# Patient Record
Sex: Male | Born: 1950 | Race: White | Hispanic: No | Marital: Married | Smoking: Never smoker
Health system: Southern US, Community
[De-identification: ages and names within clinical notes are randomized; demographics above are authoritative.]

## PROBLEM LIST (undated history)

## (undated) DIAGNOSIS — T7840XA Allergy, unspecified, initial encounter: Secondary | ICD-10-CM

## (undated) DIAGNOSIS — Z9889 Other specified postprocedural states: Secondary | ICD-10-CM

## (undated) DIAGNOSIS — I10 Essential (primary) hypertension: Secondary | ICD-10-CM

## (undated) DIAGNOSIS — T4145XA Adverse effect of unspecified anesthetic, initial encounter: Secondary | ICD-10-CM

## (undated) DIAGNOSIS — R6889 Other general symptoms and signs: Secondary | ICD-10-CM

## (undated) DIAGNOSIS — F419 Anxiety disorder, unspecified: Secondary | ICD-10-CM

## (undated) DIAGNOSIS — E781 Pure hyperglyceridemia: Secondary | ICD-10-CM

## (undated) DIAGNOSIS — T8859XA Other complications of anesthesia, initial encounter: Secondary | ICD-10-CM

## (undated) DIAGNOSIS — G4730 Sleep apnea, unspecified: Secondary | ICD-10-CM

## (undated) DIAGNOSIS — C61 Malignant neoplasm of prostate: Secondary | ICD-10-CM

## (undated) DIAGNOSIS — F32A Depression, unspecified: Secondary | ICD-10-CM

## (undated) DIAGNOSIS — N200 Calculus of kidney: Secondary | ICD-10-CM

## (undated) DIAGNOSIS — G709 Myoneural disorder, unspecified: Secondary | ICD-10-CM

## (undated) DIAGNOSIS — K635 Polyp of colon: Secondary | ICD-10-CM

## (undated) DIAGNOSIS — F329 Major depressive disorder, single episode, unspecified: Secondary | ICD-10-CM

## (undated) DIAGNOSIS — R112 Nausea with vomiting, unspecified: Secondary | ICD-10-CM

## (undated) DIAGNOSIS — M1990 Unspecified osteoarthritis, unspecified site: Secondary | ICD-10-CM

## (undated) HISTORY — DX: Essential (primary) hypertension: I10

## (undated) HISTORY — PX: KIDNEY STONE SURGERY: SHX686

## (undated) HISTORY — DX: Sleep apnea, unspecified: G47.30

## (undated) HISTORY — PX: TONSILLECTOMY AND ADENOIDECTOMY: SUR1326

## (undated) HISTORY — PX: OTHER SURGICAL HISTORY: SHX169

## (undated) HISTORY — PX: COLONOSCOPY: SHX174

## (undated) HISTORY — DX: Pure hyperglyceridemia: E78.1

## (undated) HISTORY — DX: Depression, unspecified: F32.A

## (undated) HISTORY — DX: Malignant neoplasm of prostate: C61

## (undated) HISTORY — PX: CATARACT EXTRACTION: SUR2

## (undated) HISTORY — PX: KNEE SURGERY: SHX244

## (undated) HISTORY — DX: Polyp of colon: K63.5

## (undated) HISTORY — PX: RETINAL TEAR REPAIR CRYOTHERAPY: SHX5304

## (undated) HISTORY — DX: Myoneural disorder, unspecified: G70.9

## (undated) HISTORY — DX: Calculus of kidney: N20.0

## (undated) HISTORY — DX: Major depressive disorder, single episode, unspecified: F32.9

## (undated) HISTORY — DX: Allergy, unspecified, initial encounter: T78.40XA

## (undated) HISTORY — DX: Unspecified osteoarthritis, unspecified site: M19.90

---

## 1993-07-25 HISTORY — PX: CARDIAC CATHETERIZATION: SHX172

## 2002-01-03 ENCOUNTER — Ambulatory Visit (HOSPITAL_BASED_OUTPATIENT_CLINIC_OR_DEPARTMENT_OTHER): Admission: RE | Admit: 2002-01-03 | Discharge: 2002-01-03 | Payer: Self-pay | Admitting: Otolaryngology

## 2007-01-08 ENCOUNTER — Ambulatory Visit: Payer: Self-pay | Admitting: Gastroenterology

## 2007-01-10 ENCOUNTER — Ambulatory Visit: Payer: Self-pay | Admitting: Gastroenterology

## 2007-01-10 ENCOUNTER — Encounter: Payer: Self-pay | Admitting: Gastroenterology

## 2007-01-10 DIAGNOSIS — K5730 Diverticulosis of large intestine without perforation or abscess without bleeding: Secondary | ICD-10-CM | POA: Insufficient documentation

## 2007-01-10 DIAGNOSIS — D126 Benign neoplasm of colon, unspecified: Secondary | ICD-10-CM | POA: Insufficient documentation

## 2007-07-26 HISTORY — PX: EYE SURGERY: SHX253

## 2007-09-25 ENCOUNTER — Ambulatory Visit (HOSPITAL_COMMUNITY): Admission: RE | Admit: 2007-09-25 | Discharge: 2007-09-26 | Payer: Self-pay | Admitting: Obstetrics and Gynecology

## 2008-07-15 ENCOUNTER — Telehealth: Payer: Self-pay | Admitting: Gastroenterology

## 2008-07-16 ENCOUNTER — Ambulatory Visit: Payer: Self-pay | Admitting: Gastroenterology

## 2008-07-16 DIAGNOSIS — K5900 Constipation, unspecified: Secondary | ICD-10-CM | POA: Insufficient documentation

## 2008-07-16 DIAGNOSIS — K625 Hemorrhage of anus and rectum: Secondary | ICD-10-CM | POA: Insufficient documentation

## 2009-01-30 ENCOUNTER — Encounter: Admission: RE | Admit: 2009-01-30 | Discharge: 2009-01-30 | Payer: Self-pay | Admitting: Emergency Medicine

## 2010-12-07 NOTE — Op Note (Signed)
NAMETIERRE, NETTO NO.:  [id number]   MEDICAL RECORD NO.:  [id number]          PATIENT TYPE:  OIB   LOCATION:  2550                         FACILITY:  [facility]   PHYSICIAN:  Beulah Gandy. Ashley Royalty, M.D. 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   PROCEDURE:  1. Scleral buckle right eye.  2. Retinal photocoagulation right eye.   SURGEON:  Beulah Gandy. Ashley Royalty, M.D.   ASSISTANT:  Rosalie Doctor, MA   ANESTHESIA:  General.   DETAILS:  Usual prep and drape, 360-degree limbal peritomy.  Isolation  of four rectus muscles on 2-0 silk.  Scleral dissection from 2 o'clock  to 8 o'clock to admit a #279 intrascleral implant, diathermy  placed in  the bed.  Two sutures per quadrant for a total of five sutures were  placed in the scleral flaps.  A 279 implant was fashioned to fit against  the globe between 2 and 8 o'clock.  A 240 band was placed around the  globe with a joint at  2 o'clock with a 270 sleeve.  The belt loops at  10 o'clock and 11 o'clock, perforation site chosen at 2 o'clock in the  anterior aspect of the bed.  A moderate amount of yellow subretinal  fluid came forth, then some clear fluid came forth.  The scleral sutures  were drawn securely, knotted, and the free ends removed.  Indirect  ophthalmoscopy showed the retina to be lying nicely in place on the  scleral buckle.  The indirect ophthalmoscope laser was moved into place,  686 burns were placed around the  retinal periphery with a power of 400  milliwatts, 1000 microns each and 0.1 seconds each.  The buckle was  adjusted and trimmed.  The band was adjusted and trimmed.  Paracentesis  x1 obtained a closing tension of 15 with a Barraquer tonometer. The  conjunctiva was reposited with 7-0 chromic suture Polymyxin and  gentamicin were irrigated in the  tenon space.  Decadron 10 mg was  injected into the lower subconjunctival space.  Atropine solution was  applied.  Marcaine was injected around the globe for postop pain.  TobraDex ophthalmic ointment, a patch and shield were placed.  The  patient was awakened, and taken to recovery in satisfactory condition.   COMPLICATIONS:  None.   DURATION:  1 hour and 45 minutes.      Beulah Gandy. Ashley Royalty, M.D.  Electronically Signed    JDM/MEDQ  D:  09/25/2007  T:  09/25/2007  Job:  [id number]

## 2011-04-18 LAB — CBC
Hemoglobin: 15.3
MCHC: 35
RBC: 4.8
WBC: 7.5

## 2011-04-18 LAB — BASIC METABOLIC PANEL
CO2: 26
Calcium: 9
Creatinine, Ser: 1.1
GFR calc Af Amer: >60
GFR calc non Af Amer: >60
Sodium: 134 — ABNORMAL LOW

## 2011-11-02 ENCOUNTER — Encounter: Payer: Self-pay | Admitting: Gastroenterology

## 2011-12-21 ENCOUNTER — Ambulatory Visit: Payer: BC Managed Care – PPO

## 2011-12-21 ENCOUNTER — Ambulatory Visit (INDEPENDENT_AMBULATORY_CARE_PROVIDER_SITE_OTHER): Payer: BC Managed Care – PPO | Admitting: Emergency Medicine

## 2011-12-21 VITALS — BP 118/78 | HR 61 | Temp 97.9°F | Resp 18 | Ht 67.0 in | Wt 210.0 lb

## 2011-12-21 DIAGNOSIS — R5383 Other fatigue: Secondary | ICD-10-CM

## 2011-12-21 DIAGNOSIS — E781 Pure hyperglyceridemia: Secondary | ICD-10-CM

## 2011-12-21 DIAGNOSIS — R5381 Other malaise: Secondary | ICD-10-CM

## 2011-12-21 DIAGNOSIS — Z0000 Encounter for general adult medical examination without abnormal findings: Secondary | ICD-10-CM

## 2011-12-21 LAB — POCT CBC
Hemoglobin: 16.7 g/dL (ref 14.1–18.1)
Lymph, poc: 2.6 (ref 0.6–3.4)
MCH, POC: 32.4 pg — AB (ref 27–31.2)
MCHC: 35.2 g/dL (ref 31.8–35.4)
MPV: 8.6 fL (ref 0–99.8)
POC Granulocyte: 5.7 (ref 2–6.9)
POC LYMPH PERCENT: 28.5 %L (ref 10–50)
POC MID %: 8.1 %M (ref 0–12)
RDW, POC: 12.8 %
WBC: 9 10*3/uL (ref 4.6–10.2)

## 2011-12-21 LAB — POCT URINALYSIS DIPSTICK
Bilirubin, UA: NEGATIVE
Ketones, UA: NEGATIVE
Nitrite, UA: NEGATIVE
Protein, UA: NEGATIVE
pH, UA: 5

## 2011-12-21 LAB — POCT UA - MICROSCOPIC ONLY
Casts, Ur, LPF, POC: NEGATIVE
Crystals, Ur, HPF, POC: NEGATIVE
Yeast, UA: NEGATIVE

## 2011-12-21 LAB — COMPREHENSIVE METABOLIC PANEL
ALT: 30 U/L (ref 0–53)
AST: 34 U/L (ref 0–37)
Alkaline Phosphatase: 75 U/L (ref 39–117)
BUN: 17 mg/dL (ref 6–23)
Creat: 1.09 mg/dL (ref 0.50–1.35)
Total Bilirubin: 0.5 mg/dL (ref 0.3–1.2)

## 2011-12-21 LAB — LIPID PANEL
HDL: 26 mg/dL — ABNORMAL LOW (ref >39)
Total CHOL/HDL Ratio: 9.1 Ratio
Triglycerides: 1765 mg/dL — ABNORMAL HIGH (ref <150)

## 2011-12-21 MED ORDER — LISINOPRIL 20 MG PO TABS: 20.0000 mg | ORAL_TABLET | Freq: Every day | ORAL | Status: DC

## 2011-12-21 MED ORDER — NIACIN ER (ANTIHYPERLIPIDEMIC) 500 MG PO TBCR: 500.0000 mg | EXTENDED_RELEASE_TABLET | Freq: Every day | ORAL | Status: DC

## 2011-12-21 MED ORDER — MELOXICAM 15 MG PO TABS: 15.0000 mg | ORAL_TABLET | Freq: Every day | ORAL | Status: DC

## 2011-12-21 MED ORDER — VITAMIN D (ERGOCALCIFEROL) 1.25 MG (50000 UNIT) PO CAPS: 50000.0000 [IU] | ORAL_CAPSULE | ORAL | Status: DC

## 2011-12-21 MED ORDER — VENLAFAXINE HCL 75 MG PO TABS: 75.0000 mg | ORAL_TABLET | Freq: Two times a day (BID) | ORAL | Status: DC

## 2011-12-21 MED ORDER — OMEGA-3 FATTY ACIDS 1000 MG PO CAPS: 2.0000 g | ORAL_CAPSULE | Freq: Every day | ORAL | Status: DC

## 2011-12-21 MED ORDER — AMLODIPINE BESYLATE 10 MG PO TABS: 10.0000 mg | ORAL_TABLET | Freq: Every day | ORAL | Status: DC

## 2011-12-21 NOTE — Progress Notes (Signed)
  Subjective:    Patient ID: [name], male    DOB: 09/01/1950, 61 y.o.   MRN: [id number]  HPI    Review of Systems     Objective:   Physical Exam        Assessment & Plan:

## 2011-12-21 NOTE — Progress Notes (Signed)
  Subjective:    Patient ID: [name], male    DOB: [birth year], 61 y.o.   MRN: [id number]  HPI    Review of Systems     Objective:   Physical Exam  [facility] reading (PRIMARY) by  Dr. Cleta Alberts NAD       Assessment & Plan:

## 2011-12-21 NOTE — Progress Notes (Signed)
Subjective:    Patient ID: [name], male    DOB: Apr 26, 1951, 61 y.o.   MRN: [id number]  HPI patient here for his annual physical exam. He's had no recent major issues. 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    Review of Systems  Constitutional: Positive for appetite change and fatigue.  HENT: Positive for neck stiffness.   Eyes: Positive for visual disturbance.  Genitourinary: Positive for frequency and difficulty urinating.  Musculoskeletal: Positive for back pain.       Objective:   Physical Exam  Constitutional: He is oriented to person, place, and time. He appears well-developed and well-nourished.  HENT:  Head: Normocephalic.  Eyes: Pupils are equal, round, and reactive to light.  Neck: No thyromegaly present.  Cardiovascular: Normal rate, regular rhythm and normal heart sounds.  Exam reveals no friction rub.   No murmur heard. Pulmonary/Chest: Effort normal and breath sounds normal. No respiratory distress. He has no wheezes. He has no rales. He exhibits no tenderness.  Abdominal: Soft. Bowel sounds are normal. There is no tenderness. There is no rebound.  Genitourinary:       Examination of prostate reveals the left lobe to be slightly more prominent than the right.  Musculoskeletal:       There is tenderness over the right proximal humerus. There is some pain with full abduction. There is no true rotator cuff weakness .  Neurological: He is alert and oriented to person, place, and time. He has normal reflexes. No cranial nerve deficit. Coordination normal.  Skin: Skin is warm and dry.  Psychiatric: He has a normal mood and affect. His behavior is normal. Judgment and thought content normal.         Results for orders placed in visit on 12/21/11  POCT CBC      Component Value Range   WBC 9.0  4.6 - 10.2 (K/uL)   Lymph, poc 2.6  0.6 - 3.4    POC LYMPH PERCENT 28.5  10 - 50 (%L)   MID  (cbc) 0.7  0 - 0.9    POC MID % 8.1  0 - 12 (%M)   POC Granulocyte 5.7  2 - 6.9    Granulocyte percent 63.4  37 - 80 (%G)   RBC 5.15  4.69 - 6.13 (M/uL)   Hemoglobin 16.7  14.1 - 18.1 (g/dL)   HCT, POC 40.9  81.1 - 53.7 (%)   MCV 92.2  80 - 97 (fL)   MCH, POC 32.4 (*) 27 - 31.2 (pg)   MCHC 35.2  31.8 - 35.4 (g/dL)   RDW, POC 91.4     Platelet Count, POC 271  142 - 424 (K/uL)   MPV 8.6  0 - 99.8 (fL)  POCT URINALYSIS DIPSTICK      Component Value Range   Color, UA yellow     Clarity, UA clear     Glucose, UA neg     Bilirubin, UA neg     Ketones, UA neg     Spec Grav, UA >=1.030     Blood, UA trace-lysed     pH, UA 5.0     Protein, UA neg     Urobilinogen, UA 0.2     Nitrite, UA neg     Leukocytes, UA Negative    POCT UA - MICROSCOPIC ONLY      Component Value Range   WBC, Ur, HPF, POC 0-1  RBC, urine, microscopic 0-1     Bacteria, U Microscopic trace     Mucus, UA trace     Epithelial cells, urine per micros 0-1     Crystals, Ur, HPF, POC neg     Casts, Ur, LPF, POC neg     Yeast, UA neg    IFOBT (OCCULT BLOOD)      Component Value Range   IFOBT Positive      Assessment & Plan:         Patient is overall doing well. All meds are refilled. He did have a heme positive stool and referral has been made to GI for this. His meds were refilled and labs done.

## 2012-01-18 ENCOUNTER — Ambulatory Visit: Payer: Self-pay | Admitting: Gastroenterology

## 2012-01-24 ENCOUNTER — Encounter: Payer: Self-pay | Admitting: Gastroenterology

## 2012-01-24 ENCOUNTER — Ambulatory Visit (INDEPENDENT_AMBULATORY_CARE_PROVIDER_SITE_OTHER): Payer: Self-pay | Admitting: Gastroenterology

## 2012-01-24 VITALS — BP 110/80 | HR 64 | Ht 69.0 in | Wt 201.6 lb

## 2012-01-24 DIAGNOSIS — R195 Other fecal abnormalities: Secondary | ICD-10-CM

## 2012-01-24 DIAGNOSIS — Z86010 Personal history of colonic polyps: Secondary | ICD-10-CM

## 2012-01-24 MED ORDER — MOVIPREP 100 G PO SOLR: 1.0000 | ORAL | Status: DC

## 2012-01-24 NOTE — Progress Notes (Signed)
HPI: This is a   very pleasant 61 year old man whom I last saw about 5 years ago for a routine colonoscopy.  Colonoscopy 12/2006 for routine screening, left sided tics and small adenoma removed. Recommended to have recall colonoscopy at 5 year interval.  Reminder letter sent 2 months ago.  Went to pcp, heme tested and was positive for blood.    CBC and complete metabolic profile may 2013 were both normal.  He was found to have extremely high triglyceride level. Thyroid testing was normal.  He sees minor rectal bleeding with hemorrhoid every 1-2 months.   No significant constipation or diarrhea.  Weight overall stable.  He lives here but he works in [place], with the [employer], football team     Review of systems: Pertinent positive and negative review of systems were noted in the above HPI section. Complete review of systems was performed and was otherwise normal.    Past Medical History  Diagnosis Date  . Pure hyperglyceridemia   . Benign neoplasm of colon   . Colon polyps   . Sleep apnea   . Depression   . Hypertension   . Kidney stones     Past Surgical History  Procedure Date  . Retinal tear repair cryotherapy   . Knee surgery   . Kidney stone surgery   . Tonsillectomy and adenoidectomy     Current Outpatient Prescriptions  Medication Sig Dispense Refill  . amLODipine (NORVASC) 10 MG tablet Take 1 tablet (10 mg total) by mouth daily.  30 tablet  11  . Cyanocobalamin (VITAMIN B 12) 100 MCG LOZG Take by mouth.      . fish oil-omega-3 fatty acids 1000 MG capsule Take 2 capsules (2 g total) by mouth daily.  30 capsule  11  . lisinopril (PRINIVIL,ZESTRIL) 20 MG tablet Take 1 tablet (20 mg total) by mouth daily.  30 tablet  11  . meloxicam (MOBIC) 15 MG tablet Take 1 tablet (15 mg total) by mouth daily.  30 tablet  11  . niacin (NIASPAN) 500 MG CR tablet Take 1 tablet (500 mg total) by mouth at bedtime.  30 tablet  11  . venlafaxine  (EFFEXOR) 75 MG tablet Take 1 tablet (75 mg total) by mouth 2 (two) times daily.  30 tablet  11  . Vitamin D, Ergocalciferol, (DRISDOL) 50000 UNITS CAPS Take 1 capsule (50,000 Units total) by mouth every 7 (seven) days.  30 capsule  1    Allergies as of 01/24/2012  . (No Known Allergies)    Family History  Problem Relation Age of Onset  . Clotting disorder Mother   . Colon polyps Father   . Diabetes Father     History   Social History  . Marital Status: Married    Spouse Name: N/A    Number of Children: 3  . Years of Education: N/A   Occupational History  . Football coach    Social History Main Topics  . Smoking status: Never Smoker   . Smokeless tobacco: Never Used  . Alcohol Use: Yes     social  . Drug Use: No  . Sexually Active: Not on file   Other Topics Concern  . Not on file   Social History Narrative  . No narrative on file       Physical Exam: BP 110/80  Pulse 64  Ht 5\' 9"  (1.753 m)  Wt 201 lb 9.6 oz (91.445 kg)  BMI 29.77 kg/m2 Constitutional: generally  well-appearing Psychiatric: alert and oriented x3 Eyes: extraocular movements intact Mouth: oral pharynx moist, no lesions Neck: supple no lymphadenopathy Cardiovascular: heart regular rate and rhythm Lungs: clear to auscultation bilaterally Abdomen: soft, nontender, nondistended, no obvious ascites, no peritoneal signs, normal bowel sounds Extremities: no lower extremity edema bilaterally Skin: no lesions on visible extremities    Assessment and plan: 61 y.o. male with  personal history of adenomatous colon polyps, Hemoccult-positive stool  He is due for five-year recall colonoscopy around now and we will set that up for him.

## 2012-01-24 NOTE — Patient Instructions (Addendum)
You will be set up for a colonoscopy for hemocult positive stool, personal history of adenomatous polyps.

## 2012-01-25 ENCOUNTER — Other Ambulatory Visit: Payer: Self-pay | Admitting: Dermatology

## 2012-01-25 ENCOUNTER — Encounter: Payer: Self-pay | Admitting: *Deleted

## 2012-01-25 NOTE — Telephone Encounter (Signed)
error 

## 2012-01-30 ENCOUNTER — Ambulatory Visit (AMBULATORY_SURGERY_CENTER): Payer: BC Managed Care – PPO | Admitting: Gastroenterology

## 2012-01-30 ENCOUNTER — Encounter: Payer: Self-pay | Admitting: Gastroenterology

## 2012-01-30 VITALS — BP 116/73 | HR 74 | Temp 97.8°F | Resp 18 | Ht 69.0 in | Wt 201.6 lb

## 2012-01-30 DIAGNOSIS — K644 Residual hemorrhoidal skin tags: Secondary | ICD-10-CM

## 2012-01-30 DIAGNOSIS — Z86010 Personal history of colonic polyps: Secondary | ICD-10-CM

## 2012-01-30 DIAGNOSIS — K5730 Diverticulosis of large intestine without perforation or abscess without bleeding: Secondary | ICD-10-CM

## 2012-01-30 DIAGNOSIS — R195 Other fecal abnormalities: Secondary | ICD-10-CM

## 2012-01-30 MED ORDER — SODIUM CHLORIDE 0.9 % IV SOLN: 500.0000 mL | INTRAVENOUS | Status: DC

## 2012-01-30 NOTE — Op Note (Signed)
[facility] [address] [place], [place]  [address]  COLONOSCOPY PROCEDURE REPORT  PATIENT:  [name], [name]  MR#:  [id number] BIRTHDATE:  1950/12/23, 60 yrs. old  GENDER:  male ENDOSCOPIST:  Rachael Fee, MD PROCEDURE DATE:  01/30/2012 PROCEDURE:  Colonoscopy 01027 ASA CLASS:  Class II INDICATIONS:  2008 colonoscopy with TA; also recent heme + stool by PCP MEDICATIONS:   Fentanyl 75 mcg IV, These medications were titrated to patient response per physician's verbal order, Versed 7 mg IV  DESCRIPTION OF PROCEDURE:   After the risks benefits and alternatives of the procedure were thoroughly explained, informed consent was obtained.  Digital rectal exam was performed and revealed no rectal masses.   The LB [id number] endoscope was introduced through the anus and advanced to the cecum, which was identified by both the appendix and ileocecal valve, without limitations.  The quality of the prep was good..  The instrument was then slowly withdrawn as the colon was fully examined. <<PROCEDUREIMAGES>> FINDINGS:  Mild diverticulosis was found in the sigmoid to descending colon segments (see image1).  Internal and external Hemorrhoids were found.  This was otherwise a normal examination of the colon (see image2, image3, and image5).   Retroflexed views in the rectum revealed no abnormalities. COMPLICATIONS:  None  ENDOSCOPIC IMPRESSION: 1) Mild diverticulosis in the sigmoid to descending colon segments 2) Internal and external hemorrhoids 3) Otherwise normal examination; no polyps or cancers  RECOMMENDATIONS: 1) Given your personal history of adenomatous (pre-cancerous) polyps, you will need a repeat colonoscopy in 5 years.  REPEAT EXAM:  5 years  ______________________________ Rachael Fee, MD  n. eSIGNED:   Rachael Fee at 01/30/2012 02:48 PM  Harolyn Rutherford, [phone]

## 2012-01-30 NOTE — Progress Notes (Signed)
Patient did not experience any of the following events: a burn prior to discharge; a fall within the facility; wrong site/side/patient/procedure/implant event; or a hospital transfer or hospital admission upon discharge from the facility. (G8907) Patient did not have preoperative order for IV antibiotic SSI prophylaxis. (G8918)  

## 2012-01-30 NOTE — Patient Instructions (Signed)
Diverticulosis and hemorrhoids seen today. See handouts. Repeat colonoscopy in 5 years. Thank you!!   YOU HAD AN ENDOSCOPIC PROCEDURE TODAY AT THE [facility]: Refer to the procedure report that was given to you for any specific questions about what was found during the examination.  If the procedure report does not answer your questions, please call your gastroenterologist to clarify.  If you requested that your care partner not be given the details of your procedure findings, then the procedure report has been included in a sealed envelope for you to review at your convenience later.  YOU SHOULD EXPECT: Some feelings of bloating in the abdomen. Passage of more gas than usual.  Walking can help get rid of the air that was put into your GI tract during the procedure and reduce the bloating. If you had a lower endoscopy (such as a colonoscopy or flexible sigmoidoscopy) you may notice spotting of blood in your stool or on the toilet paper. If you underwent a bowel prep for your procedure, then you may not have a normal bowel movement for a few days.  DIET: Your first meal following the procedure should be a light meal and then it is ok to progress to your normal diet.  A half-sandwich or bowl of soup is an example of a good first meal.  Heavy or fried foods are harder to digest and may make you feel nauseous or bloated.  Likewise meals heavy in dairy and vegetables can cause extra gas to form and this can also increase the bloating.  Drink plenty of fluids but you should avoid alcoholic beverages for 24 hours.  ACTIVITY: Your care partner should take you home directly after the procedure.  You should plan to take it easy, moving slowly for the rest of the day.  You can resume normal activity the day after the procedure however you should NOT DRIVE or use heavy machinery for 24 hours (because of the sedation medicines used during the test).    SYMPTOMS TO REPORT IMMEDIATELY: A  gastroenterologist can be reached at any hour.  During normal business hours, 8:30 AM to 5:00 PM Monday through Friday, call [phone].  After hours and on weekends, please call the GI answering service at [phone] who will take a message and have the physician on call contact you.   Following lower endoscopy (colonoscopy or flexible sigmoidoscopy):  Excessive amounts of blood in the stool  Significant tenderness or worsening of abdominal pains  Swelling of the abdomen that is new, acute  Fever of 100F or higher  FOLLOW UP: If any biopsies were taken you will be contacted by phone or by letter within the next 1-3 weeks.  Call your gastroenterologist if you have not heard about the biopsies in 3 weeks.  Our staff will call the home number listed on your records the next business day following your procedure to check on you and address any questions or concerns that you may have at that time regarding the information given to you following your procedure. This is a courtesy call and so if there is no answer at the home number and we have not heard from you through the emergency physician on call, we will assume that you have returned to your regular daily activities without incident.  SIGNATURES/CONFIDENTIALITY: You and/or your care partner have signed paperwork which will be entered into your electronic medical record.  These signatures attest to the fact that that the information above on your After  Visit Summary has been reviewed and is understood.  Full responsibility of the confidentiality of this discharge information lies with you and/or your care-partner.

## 2012-01-31 ENCOUNTER — Telehealth: Payer: Self-pay

## 2012-01-31 NOTE — Telephone Encounter (Signed)
Left message on answering machine. 

## 2012-02-17 ENCOUNTER — Other Ambulatory Visit: Payer: Self-pay | Admitting: Emergency Medicine

## 2012-02-22 ENCOUNTER — Telehealth: Payer: Self-pay

## 2012-02-22 NOTE — Telephone Encounter (Signed)
He is to take his lisinopril twice a day. He is also to take his Norvasc. Okay to call in lisinopril 20 twice a day and Norvasc 10 one a day

## 2012-02-22 NOTE — Telephone Encounter (Signed)
Lisinopril 20mg  was twice daily, but now the rx is written for once daily, also he is taking Norvasc, wants to know if this is an intentional change or was it in error? Dr Cleta Alberts gave him this at his physical, also is asking if he can cut back to one BP agent or if he should continue both. Please advise

## 2012-02-22 NOTE — Telephone Encounter (Signed)
He is supposed to be on lisinopril 20 twice a day and Norvasc 10 once a day if these are the medications he was on before he is to stay on the same medications he was on when he came for his physical .

## 2012-02-22 NOTE — Telephone Encounter (Signed)
TERESA WOULD LIKE TO SPEAK WITH SOMEONE ABOUT HER HUSBAND'S LISINOPRIL. THERE IS A MIX UP AS TO HOW HE IS TO TAKE HIS MEDS PLEASE CALL [phone]

## 2012-02-23 MED ORDER — LISINOPRIL 20 MG PO TABS: 20.0000 mg | ORAL_TABLET | Freq: Two times a day (BID) | ORAL | Status: DC

## 2012-02-23 NOTE — Telephone Encounter (Signed)
Spoke w/wife and verified that pt had been taking lisinopril BID, not QD, and was also taking the amlodipine. Wife wondered if pt needed to be on both BP meds and stated he had been placed on the 2nd one last Dec. Advised her that Dr Ellis Parents intention was to leave pt on same BP meds/doses as he had been on bc he felt like his BP was well controlled on these. Suggested that pt could keep a diary of his BPs readings for a few weeks prior to next OV so that he could D/W Dr Cleta Alberts about whether he could try cutting back down to just one. Wife agreed. Sending in corrected Rx to pharmacy.

## 2012-03-09 ENCOUNTER — Telehealth: Payer: Self-pay

## 2012-03-09 MED ORDER — VENLAFAXINE HCL 75 MG PO TABS: 75.0000 mg | ORAL_TABLET | Freq: Two times a day (BID) | ORAL | Status: DC

## 2012-03-09 NOTE — Telephone Encounter (Signed)
Effexor 75 mg pharmacy is asking if we can change the quantity to #60 he is taking bid. Chart indicates one year supply given, have corrected our error and sent to pharmacy

## 2012-03-09 NOTE — Telephone Encounter (Signed)
[facility]acy [facility] in [place] in is need auth to increase pt qaunity of medication from 30 to 60 please contact Lisa at [facility] [phone]

## 2012-12-31 ENCOUNTER — Other Ambulatory Visit: Payer: Self-pay | Admitting: Emergency Medicine

## 2013-01-19 ENCOUNTER — Ambulatory Visit (INDEPENDENT_AMBULATORY_CARE_PROVIDER_SITE_OTHER): Payer: BC Managed Care – PPO | Admitting: Emergency Medicine

## 2013-01-19 VITALS — BP 108/70 | HR 60 | Temp 98.3°F | Resp 12 | Ht 66.75 in | Wt 202.0 lb

## 2013-01-19 DIAGNOSIS — M129 Arthropathy, unspecified: Secondary | ICD-10-CM

## 2013-01-19 DIAGNOSIS — Z23 Encounter for immunization: Secondary | ICD-10-CM

## 2013-01-19 DIAGNOSIS — E559 Vitamin D deficiency, unspecified: Secondary | ICD-10-CM

## 2013-01-19 DIAGNOSIS — F32A Depression, unspecified: Secondary | ICD-10-CM

## 2013-01-19 DIAGNOSIS — M1990 Unspecified osteoarthritis, unspecified site: Secondary | ICD-10-CM

## 2013-01-19 DIAGNOSIS — E781 Pure hyperglyceridemia: Secondary | ICD-10-CM

## 2013-01-19 DIAGNOSIS — F329 Major depressive disorder, single episode, unspecified: Secondary | ICD-10-CM

## 2013-01-19 DIAGNOSIS — I10 Essential (primary) hypertension: Secondary | ICD-10-CM

## 2013-01-19 DIAGNOSIS — Z0000 Encounter for general adult medical examination without abnormal findings: Secondary | ICD-10-CM

## 2013-01-19 LAB — LIPID PANEL
LDL Cholesterol: 70 mg/dL (ref 0–99)
Triglycerides: 316 mg/dL — ABNORMAL HIGH (ref <150)
VLDL: 63 mg/dL — ABNORMAL HIGH (ref 0–40)

## 2013-01-19 LAB — COMPREHENSIVE METABOLIC PANEL
ALT: 19 U/L (ref 0–53)
AST: 25 U/L (ref 0–37)
Albumin: 4.3 g/dL (ref 3.5–5.2)
Calcium: 9.4 mg/dL (ref 8.4–10.5)
Chloride: 106 mEq/L (ref 96–112)
Creat: 1.29 mg/dL (ref 0.50–1.35)
Potassium: 4.8 mEq/L (ref 3.5–5.3)
Sodium: 139 mEq/L (ref 135–145)
Total Protein: 6.5 g/dL (ref 6.0–8.3)

## 2013-01-19 LAB — POCT CBC
Hemoglobin: 15 g/dL (ref 14.1–18.1)
Lymph, poc: 1.8 (ref 0.6–3.4)
MCH, POC: 32.2 pg — AB (ref 27–31.2)
MCHC: 32.5 g/dL (ref 31.8–35.4)
MID (cbc): 0.5 (ref 0–0.9)
MPV: 8.5 fL (ref 0–99.8)
POC MID %: 8 %M (ref 0–12)
WBC: 6.1 10*3/uL (ref 4.6–10.2)

## 2013-01-19 LAB — POCT URINALYSIS DIPSTICK
Bilirubin, UA: NEGATIVE
Blood, UA: NEGATIVE
Glucose, UA: NEGATIVE
Nitrite, UA: NEGATIVE
Spec Grav, UA: 1.025

## 2013-01-19 LAB — PSA: PSA: 4.58 ng/mL — ABNORMAL HIGH (ref <=4.00)

## 2013-01-19 LAB — POCT UA - MICROSCOPIC ONLY: Crystals, Ur, HPF, POC: NEGATIVE

## 2013-01-19 MED ORDER — VITAMIN D (ERGOCALCIFEROL) 1.25 MG (50000 UNIT) PO CAPS: 50000.0000 [IU] | ORAL_CAPSULE | ORAL | Status: DC

## 2013-01-19 MED ORDER — VENLAFAXINE HCL 75 MG PO TABS: 75.0000 mg | ORAL_TABLET | Freq: Two times a day (BID) | ORAL | Status: DC

## 2013-01-19 MED ORDER — MELOXICAM 15 MG PO TABS: 15.0000 mg | ORAL_TABLET | Freq: Every day | ORAL | Status: DC

## 2013-01-19 MED ORDER — AMLODIPINE BESYLATE 10 MG PO TABS: 10.0000 mg | ORAL_TABLET | Freq: Every day | ORAL | Status: DC

## 2013-01-19 MED ORDER — NIACIN ER (ANTIHYPERLIPIDEMIC) 500 MG PO TBCR: 500.0000 mg | EXTENDED_RELEASE_TABLET | Freq: Every day | ORAL | Status: DC

## 2013-01-19 MED ORDER — LISINOPRIL 20 MG PO TABS: 20.0000 mg | ORAL_TABLET | Freq: Two times a day (BID) | ORAL | Status: DC

## 2013-01-19 NOTE — Progress Notes (Signed)
@[email or web address] @  Patient ID: [name] MRN: [id number], DOB: 04-12-1951 62 y.o. Date of Encounter: 01/19/2013, 9:24 AM  Primary Physician: Lucilla Edin, MD  Chief Complaint: Physical (CPE)  HPI: 62 y.o. y/o male with history noted below here for CPE.  Doing well. No issues/complaints.  Review of Systems:  Consitutional: No fever, chills, fatigue, night sweats, lymphadenopathy, or weight changes. He has a history of depression and is currently on Effexor Eyes: No visual changes, eye redness, or discharge. ENT/Mouth: Ears: No otalgia, tinnitus, hearing loss, discharge. Nose: No congestion, rhinorrhea, sinus pain, or epistaxis. Throat: No sore throat, post nasal drip, or teeth pain. Cardiovascular: No CP, palpitations, diaphoresis, DOE, edema, orthopnea, PND. Respiratory: No cough, hemoptysis, SOB, or wheezing. Gastrointestinal: No anorexia, dysphagia, reflux, pain, nausea, vomiting, hematemesis, diarrhea, constipation, he intermittently had some bright red blood on his toilet paper  or melena. Genitourinary: No dysuria, frequency, urgency, hematuria, incontinence, nocturia, decreased urinary stream, discharge, impotence, or testicular pain/masses. Musculoskeletal: No decreased ROM, myalgias, stiffness, joint swelling, or weakness. Skin: No rash, erythema, lesion changes, pain, warmth, jaundice, or pruritis. Neurological: No headache, dizziness, syncope, seizures, tremors, memory loss, coordination problems, or paresthesias. Psychological: No anxiety, depression, hallucinations, SI/HI. Endocrine: No fatigue, polydipsia, polyphagia, polyuria, or known diabetes. All other systems were reviewed and are otherwise negative.  Past Medical History  Diagnosis Date  . Pure hyperglyceridemia   . Colon polyps   . Sleep apnea     wears CPAP  . Hypertension   . Kidney stones   . Allergy   . Arthritis   . Cataract   . Depression     no per pt  . Neuromuscular disorder     pinched nerve  right arm     Past Surgical History  Procedure Laterality Date  . Retinal tear repair cryotherapy      right  . Knee surgery      both knees done  . Kidney stone surgery    . Tonsillectomy and adenoidectomy    . Cataract extraction      left  . Colonoscopy    . Eye surgery      Home Meds:  Prior to Admission medications   Medication Sig Start Date End Date Taking? Authorizing Provider  amLODipine (NORVASC) 10 MG tablet Take 1 tablet (10 mg total) by mouth daily. PATIENT NEEDS OFFICE VISIT FOR ADDITIONAL REFILLS 12/31/12  Yes Collene Gobble, MD  Cyanocobalamin (VITAMIN B 12) 100 MCG LOZG Take by mouth.   Yes Historical Provider, MD  fish oil-omega-3 fatty acids 1000 MG capsule Take 2 capsules (2 g total) by mouth daily. 12/21/11  Yes Collene Gobble, MD  lisinopril (PRINIVIL,ZESTRIL) 20 MG tablet Take 1 tablet (20 mg total) by mouth 2 (two) times daily. 02/23/12  Yes Collene Gobble, MD  meloxicam (MOBIC) 15 MG tablet Take 1 tablet (15 mg total) by mouth daily. PATIENT NEEDS OFFICE VISIT FOR ADDITIONAL REFILLS 12/31/12  Yes Collene Gobble, MD  niacin (NIASPAN) 500 MG CR tablet Take 1 tablet (500 mg total) by mouth at bedtime. 12/21/11  Yes Collene Gobble, MD  venlafaxine (EFFEXOR) 75 MG tablet Take 1 tablet (75 mg total) by mouth 2 (two) times daily. 03/09/12  Yes Collene Gobble, MD  Vitamin D, Ergocalciferol, (DRISDOL) 50000 UNITS CAPS Take 1 capsule (50,000 Units total) by mouth every 7 (seven) days. 12/21/11  Yes Collene Gobble, MD    Allergies: No Known Allergies  History   Social History  .  Marital Status: Married    Spouse Name: N/A    Number of Children: 3  . Years of Education: N/A   Occupational History  . Football coach    Social History Main Topics  . Smoking status: Never Smoker   . Smokeless tobacco: Never Used  . Alcohol Use: Yes     Comment: social  . Drug Use: No  . Sexually Active: Not on file   Other Topics Concern  . Not on file   Social History Narrative  . No  narrative on file    Family History  Problem Relation Age of Onset  . Clotting disorder Mother   . Colon polyps Father   . Diabetes Father   . Colon cancer Neg Hx   . Esophageal cancer Neg Hx   . Stomach cancer Neg Hx   . Rectal cancer Neg Hx     Physical Exam:  Blood pressure 108/70, pulse 60, temperature 98.3 F (36.8 C), temperature source Oral, resp. rate 12, height 5' 6.75" (1.695 m), weight 202 lb (91.627 kg), SpO2 96.00%.  General: Well developed, well nourished, in no acute distress. HEENT: Normocephalic, atraumatic. Conjunctiva pink, sclera non-icteric. Pupils 2 mm constricting to 1 mm, round, regular, and equally reactive to light and accomodation. EOMI. Internal auditory canal clear. TMs with good cone of light and without pathology. Nasal mucosa pink. Nares are without discharge. No sinus tenderness. Oral mucosa pink. Dentition . Pharynx without exudate.   Neck: Supple. Trachea midline. No thyromegaly. Full ROM. No lymphadenopathy. Lungs: Clear to auscultation bilaterally without wheezes, rales, or rhonchi. Breathing is of normal effort and unlabored. Cardiovascular: RRR with S1 S2. No murmurs, rubs, or gallops appreciated. Distal pulses 2+ symmetrically. No carotid or abdominal bruits.  Abdomen: Soft, non-tender, non-distended with normoactive bowel sounds. No hepatosplenomegaly or masses. No rebound/guarding. No CVA tenderness. Without hernias. He has a small 1 cm defect about 2 inches above the umbilicus Rectal: No external hemorrhoids or fissures. Rectal vault without masses.   Genitourinary:   circumcised male. No penile lesions. Testes descended bilaterally, and smooth without tenderness or masses.  Musculoskeletal: Full range of motion and 5/5 strength throughout. Without swelling, atrophy, tenderness, crepitus, or warmth. Extremities without clubbing, cyanosis, or edema. Calves supple. Skin: Warm and moist without erythema, ecchymosis, wounds, or rash. Neuro:  A+Ox3. CN II-XII grossly intact. Moves all extremities spontaneously. Full sensation throughout. Normal gait. DTR 2+ throughout upper and lower extremities. Finger to nose intact. Psych:  Responds to questions appropriately with a normal affect.   Studies:  Results for orders placed in visit on 01/19/13  POCT UA - MICROSCOPIC ONLY      Result Value Range   WBC, Ur, HPF, POC 0-2     RBC, urine, microscopic 0-1     Bacteria, U Microscopic trace     Mucus, UA neg     Epithelial cells, urine per micros 0-1     Crystals, Ur, HPF, POC neg     Casts, Ur, LPF, POC neg     Yeast, UA neg    POCT URINALYSIS DIPSTICK      Result Value Range   Color, UA yellow     Clarity, UA clear     Glucose, UA neg     Bilirubin, UA neg     Ketones, UA neg     Spec Grav, UA 1.025     Blood, UA neg     pH, UA 5.0     Protein, UA neg  Urobilinogen, UA 0.2     Nitrite, UA neg     Leukocytes, UA Negative    IFOBT (OCCULT BLOOD)      Result Value Range   IFOBT Negative    POCT CBC      Result Value Range   WBC 6.1  4.6 - 10.2 K/uL   Lymph, poc 1.8  0.6 - 3.4   POC LYMPH PERCENT 30.2  10 - 50 %L   MID (cbc) 0.5  0 - 0.9   POC MID % 8.0  0 - 12 %M   POC Granulocyte 3.8  2 - 6.9   Granulocyte percent 61.8  37 - 80 %G   RBC 4.66 (*) 4.69 - 6.13 M/uL   Hemoglobin 15.0  14.1 - 18.1 g/dL   HCT, POC 16.1  09.6 - 53.7 %   MCV 99.0 (*) 80 - 97 fL   MCH, POC 32.2 (*) 27 - 31.2 pg   MCHC 32.5  31.8 - 35.4 g/dL   RDW, POC 04.5     Platelet Count, POC 197  142 - 424 K/uL   MPV 8.5  0 - 99.8 fL   EKG normal sinus rhythm.    Assessment/Plan:  62 y.o. y/o with multiple medical problems. He has hypertension depression hypertriglyceridemia vitamin D deficiency and arthritis. All medical problems are stable at the present time to medications or refill to routine labs were done he is up-to-date on his colonoscopy. He was updated on his Tdap. he was given a prescription for shingles vaccine. He does have a  small periumbilical hernia and was alerted to the danger signs for this  -  Signed, Earl Lites, MD 01/19/2013 9:24 AM

## 2013-01-23 ENCOUNTER — Telehealth: Payer: Self-pay

## 2013-01-23 NOTE — Telephone Encounter (Signed)
PT STATES WE HAD CALLED REGARDING HIS LABS. PLEASE CALL [phone]

## 2013-01-23 NOTE — Telephone Encounter (Signed)
Pt notified. See labs 

## 2013-01-24 ENCOUNTER — Telehealth: Payer: Self-pay | Admitting: Emergency Medicine

## 2013-01-24 NOTE — Telephone Encounter (Signed)
I called and discussed the rising PSA with the patient. He is following up with Dr. Merry Lofty. I urged him to go ahead and have his biopsies done .

## 2013-04-02 ENCOUNTER — Telehealth: Payer: Self-pay

## 2013-04-02 NOTE — Telephone Encounter (Signed)
Urologist is Dr. Gerome Sam in [facility], have you any correspondence from him, we can call his office if necessary.

## 2013-04-02 NOTE — Telephone Encounter (Signed)
Do you want me to call or do you feel like you should?

## 2013-04-02 NOTE — Telephone Encounter (Signed)
pts wife called and wants to talk with Dr Cleta Alberts only, regarding her husbands prostate cancer diagnosis. They are currently in [place], but Mrs. Whiters, would like to update Dr Cleta Alberts on what's going on with care

## 2013-04-02 NOTE — Telephone Encounter (Signed)
Called and discussed situation with the patient's wife. [name] is having treatment for his documented prostate cancer through a Dr. Obie Dredge in [place] this will involve 30 directed radiation treatments to the area of cancer in his prostate gland. I will get the results of biopsy results with Dr. Merry Lofty and they have agreed to send me the results of the records from [place].

## 2014-01-14 ENCOUNTER — Other Ambulatory Visit: Payer: Self-pay | Admitting: Physician Assistant

## 2014-01-29 ENCOUNTER — Other Ambulatory Visit: Payer: Self-pay | Admitting: Emergency Medicine

## 2014-01-31 ENCOUNTER — Ambulatory Visit (INDEPENDENT_AMBULATORY_CARE_PROVIDER_SITE_OTHER): Payer: BC Managed Care – PPO

## 2014-01-31 ENCOUNTER — Ambulatory Visit (INDEPENDENT_AMBULATORY_CARE_PROVIDER_SITE_OTHER): Payer: BC Managed Care – PPO | Admitting: Emergency Medicine

## 2014-01-31 VITALS — BP 110/70 | HR 63 | Temp 97.9°F | Resp 16 | Ht 66.5 in | Wt 209.0 lb

## 2014-01-31 DIAGNOSIS — F32A Depression, unspecified: Secondary | ICD-10-CM

## 2014-01-31 DIAGNOSIS — J329 Chronic sinusitis, unspecified: Secondary | ICD-10-CM

## 2014-01-31 DIAGNOSIS — M545 Low back pain, unspecified: Secondary | ICD-10-CM

## 2014-01-31 DIAGNOSIS — I10 Essential (primary) hypertension: Secondary | ICD-10-CM

## 2014-01-31 DIAGNOSIS — M129 Arthropathy, unspecified: Secondary | ICD-10-CM

## 2014-01-31 DIAGNOSIS — N529 Male erectile dysfunction, unspecified: Secondary | ICD-10-CM

## 2014-01-31 DIAGNOSIS — C61 Malignant neoplasm of prostate: Secondary | ICD-10-CM

## 2014-01-31 DIAGNOSIS — F3289 Other specified depressive episodes: Secondary | ICD-10-CM

## 2014-01-31 DIAGNOSIS — E781 Pure hyperglyceridemia: Secondary | ICD-10-CM

## 2014-01-31 DIAGNOSIS — M25561 Pain in right knee: Secondary | ICD-10-CM

## 2014-01-31 DIAGNOSIS — F329 Major depressive disorder, single episode, unspecified: Secondary | ICD-10-CM

## 2014-01-31 DIAGNOSIS — M1990 Unspecified osteoarthritis, unspecified site: Secondary | ICD-10-CM

## 2014-01-31 DIAGNOSIS — M519 Unspecified thoracic, thoracolumbar and lumbosacral intervertebral disc disorder: Secondary | ICD-10-CM

## 2014-01-31 DIAGNOSIS — Z0000 Encounter for general adult medical examination without abnormal findings: Secondary | ICD-10-CM

## 2014-01-31 DIAGNOSIS — M25569 Pain in unspecified knee: Secondary | ICD-10-CM

## 2014-01-31 LAB — CBC WITH DIFFERENTIAL/PLATELET
Basophils Absolute: 0.1 10*3/uL (ref 0.0–0.1)
Basophils Relative: 1 % (ref 0–1)
EOS ABS: 0.1 10*3/uL (ref 0.0–0.7)
EOS PCT: 2 % (ref 0–5)
HCT: 41.2 % (ref 39.0–52.0)
Hemoglobin: 14.9 g/dL (ref 13.0–17.0)
LYMPHS ABS: 1.4 10*3/uL (ref 0.7–4.0)
Lymphocytes Relative: 21 % (ref 12–46)
MCH: 32.4 pg (ref 26.0–34.0)
MCHC: 36.2 g/dL — ABNORMAL HIGH (ref 30.0–36.0)
MCV: 89.6 fL (ref 78.0–100.0)
MONO ABS: 0.5 10*3/uL (ref 0.1–1.0)
Monocytes Relative: 7 % (ref 3–12)
Neutro Abs: 4.5 10*3/uL (ref 1.7–7.7)
Neutrophils Relative %: 69 % (ref 43–77)
PLATELETS: 211 10*3/uL (ref 150–400)
RBC: 4.6 MIL/uL (ref 4.22–5.81)
RDW: 13.6 % (ref 11.5–15.5)
WBC: 6.5 10*3/uL (ref 4.0–10.5)

## 2014-01-31 LAB — POCT URINALYSIS DIPSTICK
Bilirubin, UA: NEGATIVE
Glucose, UA: NEGATIVE
Ketones, UA: NEGATIVE
Leukocytes, UA: NEGATIVE
Nitrite, UA: NEGATIVE
Protein, UA: NEGATIVE
RBC UA: NEGATIVE
Spec Grav, UA: 1.02
UROBILINOGEN UA: 0.2
pH, UA: 5

## 2014-01-31 LAB — COMPREHENSIVE METABOLIC PANEL
ALK PHOS: 85 U/L (ref 39–117)
ALT: 16 U/L (ref 0–53)
AST: 22 U/L (ref 0–37)
Albumin: 4.5 g/dL (ref 3.5–5.2)
BILIRUBIN TOTAL: 0.7 mg/dL (ref 0.2–1.2)
BUN: 13 mg/dL (ref 6–23)
CO2: 23 mEq/L (ref 19–32)
Calcium: 9.8 mg/dL (ref 8.4–10.5)
Chloride: 104 mEq/L (ref 96–112)
Creat: 1.05 mg/dL (ref 0.50–1.35)
GLUCOSE: 105 mg/dL — AB (ref 70–99)
Potassium: 4.6 mEq/L (ref 3.5–5.3)
SODIUM: 136 meq/L (ref 135–145)
Total Protein: 6.9 g/dL (ref 6.0–8.3)

## 2014-01-31 LAB — IFOBT (OCCULT BLOOD): IFOBT: NEGATIVE

## 2014-01-31 LAB — LIPID PANEL
Cholesterol: 186 mg/dL (ref 0–200)
HDL: 35 mg/dL — ABNORMAL LOW (ref >39)
LDL CALC: 106 mg/dL — AB (ref 0–99)
Total CHOL/HDL Ratio: 5.3 Ratio
Triglycerides: 224 mg/dL — ABNORMAL HIGH (ref <150)
VLDL: 45 mg/dL — ABNORMAL HIGH (ref 0–40)

## 2014-01-31 MED ORDER — NIACIN ER (ANTIHYPERLIPIDEMIC) 500 MG PO TBCR: 500.0000 mg | EXTENDED_RELEASE_TABLET | Freq: Every day | ORAL | Status: DC

## 2014-01-31 MED ORDER — AMLODIPINE BESYLATE 10 MG PO TABS: ORAL_TABLET | ORAL | Status: DC

## 2014-01-31 MED ORDER — LISINOPRIL 20 MG PO TABS
20.0000 mg | ORAL_TABLET | Freq: Two times a day (BID) | ORAL | Status: DC
Start: 2014-01-31 — End: 2014-08-13

## 2014-01-31 MED ORDER — VENLAFAXINE HCL 75 MG PO TABS: 75.0000 mg | ORAL_TABLET | Freq: Two times a day (BID) | ORAL | Status: DC

## 2014-01-31 MED ORDER — MELOXICAM 15 MG PO TABS: ORAL_TABLET | ORAL | Status: DC

## 2014-01-31 MED ORDER — TADALAFIL 20 MG PO TABS: 10.0000 mg | ORAL_TABLET | ORAL | Status: DC | PRN

## 2014-01-31 NOTE — Progress Notes (Addendum)
   Subjective:    Patient ID: [name], male    DOB: [birth year], 63 y.o.   MRN: [id number] This chart was scribed for [name], MD by Vernell Barrier, Medical Scribe. The patient was seen in room 2. This patient's care was started at 12:07 PM.  HPI HPI Comments: [name] is a 63 y.o. male who presents to the [facility] for CPE. Pt has been treating prostate cancer. States PSA was at an 8 at one point; last time it was checked # was below 1. Was living, receiving radiation and chemotherapy in [place]. Recently moved back to [place] . Discusses concern for where to continue receiving further care. Currently not working; but would like to go back soon. Last colonoscopy completed 2 years ago.   Recent surgery to remove lesion from right side of the face.   Review of Systems  Constitutional: Negative for fatigue and unexpected weight change.  Eyes: Negative for visual disturbance.  Respiratory: Negative for cough, chest tightness and shortness of breath.   Cardiovascular: Negative for chest pain, palpitations and leg swelling.  Gastrointestinal: Negative for abdominal pain and blood in stool.  Musculoskeletal: Positive for arthralgias and back pain.  Neurological: Negative for dizziness, light-headedness and headaches.   Objective:  Physical Exam CONSTITUTIONAL: Well developed/well nourished HEAD: Normocephalic/atraumatic EYES: EOMI/PERRL ENMT: Mucous membranes moist NECK: supple no meningeal signs SPINE:entire spine nontender CV: S1/S2 noted, no murmurs/rubs/gallops noted LUNGS: Lungs are clear to auscultation bilaterally, no apparent distress ABDOMEN: soft, nontender, no rebound or guarding GU:no cva tenderness NEURO: Pt is awake/alert, moves all extremitiesx4 EXTREMITIES: pulses normal, full ROM SKIN: warm, color normal PSYCH: no abnormalities of mood noted Results for orders placed in visit on 01/31/14  IFOBT (OCCULT BLOOD)      Result Value Ref  Range   IFOBT Negative    POCT URINALYSIS DIPSTICK      Result Value Ref Range   Color, UA yellow     Clarity, UA clear     Glucose, UA neg     Bilirubin, UA neg     Ketones, UA neg     Spec Grav, UA 1.020     Blood, UA neg     pH, UA 5.0     Protein, UA neg     Urobilinogen, UA 0.2     Nitrite, UA neg     Leukocytes, UA Negative    [facility] reading (PRIMARY) by  Dr.Kanon Novosel  there is a significant degenerative disc disease at L4-L5. On the knee film there may be a small loose body present. There is mild narrowing of the medial joint space  Assessment & Plan:  Routine labs were done. He is recovering well from his prostate radiation treatment. I did refill his ED drugs. Also refilled his regular medications. Referral made to Dr. Erik Obey. He may need a followup sleep study. He will need a urologist here in [place]. He does have degenerative disc disease by x-ray and may have to follow through with orthopedics because he also has a right knee problem  I personally performed the services described in this documentation, which was scribed in my presence. The recorded information has been reviewed and is accurate.

## 2014-02-01 LAB — PSA: PSA: 0.1 ng/mL (ref <=4.00)

## 2014-02-01 LAB — TSH: TSH: 2.078 u[IU]/mL (ref 0.350–4.500)

## 2014-02-13 ENCOUNTER — Telehealth: Payer: Self-pay

## 2014-02-13 NOTE — Telephone Encounter (Signed)
PA needed for Cialis. It was completed by Judson Roch on covermymeds on [employer] form as sent by [employer]. Called to check status and was told [employer] handles PAs. Was able to complete PA over the phone and received approval from 01/14/14-02/12/17, case # [id number]. Notified pharmacy.

## 2014-03-25 ENCOUNTER — Telehealth: Payer: Self-pay | Admitting: Radiology

## 2014-03-25 NOTE — Telephone Encounter (Signed)
Added Tamsulosin to his medication list.

## 2014-03-25 NOTE — Telephone Encounter (Signed)
We have gotten correspondence from [employer] concerning interaction between Cialis and Tamsulosin. Dr Everlene Farrier has indicated Tamsulosin was not on patients medication list, this was prescribed by his doctor treating his prostate Cancer, Dr Oretha Ellis. Dr Everlene Farrier does not want him to take the Cialis again, until he discusses this with Dr Oretha Ellis. I have called patient and he will discuss this with Dr Oretha Ellis and get back to [place]. He will not take the Cialis again until he discusses with Dr Oretha Ellis.

## 2014-04-11 ENCOUNTER — Telehealth: Payer: Self-pay

## 2014-04-11 DIAGNOSIS — E781 Pure hyperglyceridemia: Secondary | ICD-10-CM

## 2014-04-11 NOTE — Telephone Encounter (Signed)
Patients spouse Helene Kelp called on behalf of her husband to ask Dr. Everlene Farrier to call back on to advise patient on tapering off his blood pressure medication; he currently takes lisinopril and amlodipne. Please advise. They are aware Dr. Everlene Farrier comes in on Sunday 8-4.   Best: [phone]

## 2014-04-13 MED ORDER — METHOCARBAMOL 750 MG PO TABS: 750.0000 mg | ORAL_TABLET | Freq: Three times a day (TID) | ORAL | Status: DC | PRN

## 2014-04-13 NOTE — Telephone Encounter (Signed)
Will decrease BP med dose.Patient called

## 2014-04-13 NOTE — Telephone Encounter (Signed)
Dr. Everlene Farrier spoke with pt. The pt said that he had been having some lightheadness and when he checked his BP it was 105/60 so he stopped his Amlodipine. Per Dr. Everlene Farrier, pt is to only take 1/2 of the Norvasc and Lisinopril. So he will he on Norvasc 5 and Lisinopril 10.  Pt has also been having some muscle spasms. Dr. Everlene Farrier said to rx him Robaxin and to RTC if they don't subside.

## 2014-06-10 ENCOUNTER — Telehealth: Payer: Self-pay

## 2014-06-10 NOTE — Telephone Encounter (Signed)
Pt will be rtc tomorrow for reevaluation.

## 2014-06-10 NOTE — Telephone Encounter (Signed)
Pt of Dr. Everlene Farrier stated he was asked to call in if his "symptoms" were not getting any better. Pt is still concerned, please advise.

## 2014-06-18 ENCOUNTER — Ambulatory Visit (INDEPENDENT_AMBULATORY_CARE_PROVIDER_SITE_OTHER): Payer: MEDICARE | Admitting: Emergency Medicine

## 2014-06-18 VITALS — BP 114/68 | HR 87 | Temp 98.2°F | Resp 16 | Ht 66.5 in | Wt 205.0 lb

## 2014-06-18 DIAGNOSIS — M5137 Other intervertebral disc degeneration, lumbosacral region: Secondary | ICD-10-CM

## 2014-06-18 MED ORDER — PREDNISONE 10 MG PO TABS: ORAL_TABLET | ORAL | Status: DC

## 2014-06-18 NOTE — Progress Notes (Signed)
   Subjective:    Patient ID: [name], male    DOB: 12/04/1950, 63 y.o.   MRN: [id number] This chart was scribed for Alan Queen, MD by Marti Sleigh, Medical Scribe. This patient was seen in Room 3 and the patient's care was started at 12:20 PM.  Chief Complaint  Patient presents with  . Back Pain    chronic    HPI HPI Comments: [name] is a 63 y.o. male with a hx of lumbar disk disease and prostate cancer who presents to [facility] complaining of worsening chronic mid-back pain that radiates to his lower back as well as medial and lateral right thigh that has been an intermittent issue for multiple years. Pt endorses associated weakness and tingling in right leg. Pt also states he has occasional difficulty walking after sitting for a long period. Pt reported to [facility] 01/31/2014 for the same symptoms, and was prescribed flexeril. Pt states he had some relief of symptoms with flexeril, but states that his symptoms seem to be worsening.    Review of Systems  Constitutional: Negative for fever and chills.  Genitourinary: Positive for difficulty urinating.  Musculoskeletal: Positive for back pain.  Neurological: Positive for weakness.       Tingling       Objective:   Physical Exam  Constitutional: He is oriented to person, place, and time. He appears well-developed and well-nourished.  HENT:  Head: Normocephalic and atraumatic.  Eyes: Pupils are equal, round, and reactive to light.  Neck: Neck supple.  Cardiovascular: Normal rate and regular rhythm.   Pulmonary/Chest: Effort normal and breath sounds normal. No respiratory distress.  Musculoskeletal: He exhibits tenderness.  Tender L4-5. DTR 3+. Motor strength 5/5. Subjective hypesthesia, lateral right thigh.  Neurological: He is alert and oriented to person, place, and time.  Skin: Skin is warm and dry.  Psychiatric: He has a normal mood and affect. His behavior is normal.  Nursing note and vitals reviewed.        Assessment & Plan:  Patient placed on prednisone Dosepak. He does have some pain medication to take MRI of the LS spine without contrast has been ordered.I personally performed the services described in this documentation, which was scribed in my presence. The recorded information has been reviewed and is accurate.I personally performed the services described in this documentation, which was scribed in my presence. The recorded information has been reviewed and is accurate.

## 2014-07-04 ENCOUNTER — Ambulatory Visit
Admission: RE | Admit: 2014-07-04 | Discharge: 2014-07-04 | Disposition: A | Discharge status: Home / Self Care | Payer: Medicare Other | Source: Ambulatory Visit | Attending: Emergency Medicine | Admitting: Emergency Medicine

## 2014-07-04 DIAGNOSIS — M5137 Other intervertebral disc degeneration, lumbosacral region: Secondary | ICD-10-CM

## 2014-07-08 ENCOUNTER — Other Ambulatory Visit: Payer: Self-pay

## 2014-07-08 DIAGNOSIS — M545 Low back pain, unspecified: Secondary | ICD-10-CM

## 2014-07-10 ENCOUNTER — Telehealth: Payer: Self-pay

## 2014-07-10 NOTE — Telephone Encounter (Signed)
Spoke with patient wife that referrals has sent the information to dr Arnoldo Morale office on [phone] and they take a little while to review records especially since we are requesting dr Arnoldo Morale

## 2014-07-10 NOTE — Telephone Encounter (Signed)
Pt's wife calling to check on the status of her husband's referral to a neurologist. Please advise. CB # [phone]

## 2014-07-27 ENCOUNTER — Emergency Department (HOSPITAL_COMMUNITY): Payer: Medicare Other

## 2014-07-27 ENCOUNTER — Emergency Department (HOSPITAL_COMMUNITY)
Admission: EM | Admit: 2014-07-27 | Discharge: 2014-07-27 | Disposition: A | Discharge status: Home / Self Care | Payer: Medicare Other | Attending: Emergency Medicine | Admitting: Emergency Medicine

## 2014-07-27 ENCOUNTER — Encounter (HOSPITAL_COMMUNITY): Payer: Self-pay | Admitting: *Deleted

## 2014-07-27 DIAGNOSIS — R569 Unspecified convulsions: Secondary | ICD-10-CM | POA: Insufficient documentation

## 2014-07-27 DIAGNOSIS — Z79899 Other long term (current) drug therapy: Secondary | ICD-10-CM | POA: Diagnosis not present

## 2014-07-27 DIAGNOSIS — Z7952 Long term (current) use of systemic steroids: Secondary | ICD-10-CM | POA: Diagnosis not present

## 2014-07-27 DIAGNOSIS — Z791 Long term (current) use of non-steroidal anti-inflammatories (NSAID): Secondary | ICD-10-CM | POA: Insufficient documentation

## 2014-07-27 DIAGNOSIS — F329 Major depressive disorder, single episode, unspecified: Secondary | ICD-10-CM | POA: Diagnosis not present

## 2014-07-27 DIAGNOSIS — I10 Essential (primary) hypertension: Secondary | ICD-10-CM | POA: Diagnosis not present

## 2014-07-27 DIAGNOSIS — E780 Pure hypercholesterolemia: Secondary | ICD-10-CM | POA: Diagnosis not present

## 2014-07-27 DIAGNOSIS — R911 Solitary pulmonary nodule: Secondary | ICD-10-CM | POA: Diagnosis not present

## 2014-07-27 DIAGNOSIS — G4730 Sleep apnea, unspecified: Secondary | ICD-10-CM | POA: Diagnosis not present

## 2014-07-27 DIAGNOSIS — M1990 Unspecified osteoarthritis, unspecified site: Secondary | ICD-10-CM | POA: Diagnosis not present

## 2014-07-27 DIAGNOSIS — N200 Calculus of kidney: Secondary | ICD-10-CM | POA: Insufficient documentation

## 2014-07-27 DIAGNOSIS — Z86010 Personal history of colonic polyps: Secondary | ICD-10-CM | POA: Diagnosis not present

## 2014-07-27 DIAGNOSIS — Z8546 Personal history of malignant neoplasm of prostate: Secondary | ICD-10-CM | POA: Insufficient documentation

## 2014-07-27 DIAGNOSIS — R112 Nausea with vomiting, unspecified: Secondary | ICD-10-CM | POA: Insufficient documentation

## 2014-07-27 LAB — URINALYSIS, ROUTINE W REFLEX MICROSCOPIC
Bilirubin Urine: NEGATIVE
Glucose, UA: NEGATIVE mg/dL
Ketones, ur: NEGATIVE mg/dL
Leukocytes, UA: NEGATIVE
NITRITE: NEGATIVE
PH: 7.5 (ref 5.0–8.0)
PROTEIN: NEGATIVE mg/dL
SPECIFIC GRAVITY, URINE: 1.014 (ref 1.005–1.030)
Urobilinogen, UA: 1 mg/dL (ref 0.0–1.0)

## 2014-07-27 LAB — URINE MICROSCOPIC-ADD ON

## 2014-07-27 LAB — CBC
HEMATOCRIT: 45.4 % (ref 39.0–52.0)
Hemoglobin: 15.8 g/dL (ref 13.0–17.0)
MCH: 31 pg (ref 26.0–34.0)
MCHC: 34.8 g/dL (ref 30.0–36.0)
MCV: 89 fL (ref 78.0–100.0)
Platelets: 200 10*3/uL (ref 150–400)
RBC: 5.1 MIL/uL (ref 4.22–5.81)
RDW: 12.5 % (ref 11.5–15.5)
WBC: 12.7 10*3/uL — ABNORMAL HIGH (ref 4.0–10.5)

## 2014-07-27 LAB — BASIC METABOLIC PANEL
Anion gap: 9 (ref 5–15)
BUN: 12 mg/dL (ref 6–23)
CO2: 24 mmol/L (ref 19–32)
Calcium: 9.9 mg/dL (ref 8.4–10.5)
Chloride: 103 mEq/L (ref 96–112)
Creatinine, Ser: 1.44 mg/dL — ABNORMAL HIGH (ref 0.50–1.35)
GFR, EST AFRICAN AMERICAN: 57 mL/min — AB (ref >90)
GFR, EST NON AFRICAN AMERICAN: 50 mL/min — AB (ref >90)
Glucose, Bld: 118 mg/dL — ABNORMAL HIGH (ref 70–99)
Potassium: 4.3 mmol/L (ref 3.5–5.1)
SODIUM: 136 mmol/L (ref 135–145)

## 2014-07-27 LAB — TROPONIN I

## 2014-07-27 MED ORDER — HYDROCODONE-ACETAMINOPHEN 5-325 MG PO TABS: 2.0000 | ORAL_TABLET | ORAL | Status: DC | PRN

## 2014-07-27 MED ORDER — SODIUM CHLORIDE 0.9 % IV BOLUS (SEPSIS)
1000.0000 mL | Freq: Once | INTRAVENOUS | Status: AC
Start: 2014-07-27 — End: 2014-07-27
  Administered 2014-07-27: 1000 mL via INTRAVENOUS

## 2014-07-27 MED ORDER — HYDROMORPHONE HCL 1 MG/ML IJ SOLN
0.5000 mg | Freq: Once | INTRAMUSCULAR | Status: AC
  Administered 2014-07-27: 0.5 mg via INTRAVENOUS
  Filled 2014-07-27: qty 1

## 2014-07-27 MED ORDER — ONDANSETRON 4 MG PO TBDP: ORAL_TABLET | ORAL | Status: DC

## 2014-07-27 MED ORDER — CEPHALEXIN 500 MG PO CAPS: 500.0000 mg | ORAL_CAPSULE | Freq: Two times a day (BID) | ORAL | Status: DC

## 2014-07-27 MED ORDER — HYDROMORPHONE HCL 1 MG/ML IJ SOLN
1.0000 mg | Freq: Once | INTRAMUSCULAR | Status: AC
  Administered 2014-07-27: 1 mg via INTRAVENOUS
  Filled 2014-07-27: qty 1

## 2014-07-27 MED ORDER — ONDANSETRON HCL 4 MG/2ML IJ SOLN
4.0000 mg | Freq: Once | INTRAMUSCULAR | Status: AC
  Administered 2014-07-27: 4 mg via INTRAVENOUS
  Filled 2014-07-27: qty 2

## 2014-07-27 NOTE — ED Notes (Signed)
Pt and family member reports that pt had onset of left side flank pain last night, hx of kidney stones. Pt having nausea today, then approx 3:30 family reports pt having seizure like activity lasting approx 1 minute. Reports pts eyes were to one side and had full body jerking and twitching, followed by "glazed look in his eyes." denies any hx of seizures. Pt a&o at triage, denies difficulty urinating today.

## 2014-07-27 NOTE — ED Notes (Signed)
Pt. Refused wheelchair and left with all belongings 

## 2014-07-27 NOTE — ED Notes (Signed)
Pt knows that urine is needed

## 2014-07-27 NOTE — ED Notes (Signed)
EDP at bedside  

## 2014-07-27 NOTE — Discharge Instructions (Signed)
If you were given medicines take as directed.  If you are on coumadin or contraceptives realize their levels and effectiveness is altered by many different medicines.  If you have any reaction (rash, tongues swelling, other) to the medicines stop taking and see a physician.   Please follow up as directed and return to the ER or see a physician for new or worsening symptoms.  Thank you. Filed Vitals:   07/27/14 1934 07/27/14 1945 07/27/14 2000 07/27/14 2126  BP: 126/80 131/75 129/68 122/75  Pulse: 63 61 63 60  Temp: 98.6 F (37 C)   98.7 F (37.1 C)  TempSrc: Oral   Oral  Resp: 26 21 29 23   SpO2: 95% 95% 94% 100%

## 2014-07-27 NOTE — ED Provider Notes (Signed)
CSN: [id number]     Arrival date & time 07/27/14  1644 History   First MD Initiated Contact with Patient 07/27/14 1716     Chief Complaint  Patient presents with  . Flank Pain  . Seizures     (Consider location/radiation/quality/duration/timing/severity/associated sxs/prior Treatment) HPI Comments: 64 year old male with history of prostate cancer treated radiation, lumbar disc disease presents with left flank pain severe nausea and seizure episode. Patient has had intermittent lower back pain that is followed outpatient known disc dz/ arthritis presents with pain is different than his normal lower back pain. This pain is left lower flank, no history of similar, patient has had bladder stones before but no acute kidney stone that he recalls. Pain is intermittent severe making him nauseous unable to get comfortable. No aneurysm history, mild chills. After severe pain episode patient had brief seizure activity lasting about 45 seconds with eyes looking to the left. No history of seizure, no history of brain problems in the past. No significant head injury no fevers. Patient has had an MRI of his lower back without signs of cancer metastasis per wife.  Patient is a 64 y.o. male presenting with flank pain and seizures. The history is provided by the patient and the spouse.  Flank Pain Pertinent negatives include no chest pain, no abdominal pain, no headaches and no shortness of breath.  Seizures   Past Medical History  Diagnosis Date  . Pure hyperglyceridemia   . Colon polyps   . Sleep apnea     wears CPAP  . Hypertension   . Kidney stones   . Allergy   . Arthritis   . Cataract   . Depression     no per pt  . Neuromuscular disorder     pinched nerve right arm   Past Surgical History  Procedure Laterality Date  . Retinal tear repair cryotherapy      right  . Knee surgery      both knees done  . Kidney stone surgery    . Tonsillectomy and adenoidectomy    . Cataract extraction      left  . Colonoscopy    . Eye surgery     Family History  Problem Relation Age of Onset  . Clotting disorder Mother   . Colon polyps Father   . Diabetes Father   . Colon cancer Neg Hx   . Esophageal cancer Neg Hx   . Stomach cancer Neg Hx   . Rectal cancer Neg Hx    History  Substance Use Topics  . Smoking status: Never Smoker   . Smokeless tobacco: Never Used  . Alcohol Use: Yes     Comment: social    Review of Systems  Constitutional: Positive for chills, diaphoresis, appetite change and fatigue. Negative for fever.  HENT: Negative for congestion.   Eyes: Negative for visual disturbance.  Respiratory: Negative for shortness of breath.   Cardiovascular: Negative for chest pain.  Gastrointestinal: Positive for nausea and vomiting. Negative for abdominal pain.  Genitourinary: Positive for flank pain. Negative for dysuria.  Musculoskeletal: Positive for back pain. Negative for neck pain and neck stiffness.  Skin: Negative for rash.  Neurological: Positive for seizures. Negative for syncope, light-headedness and headaches.      Allergies  Review of patient's allergies indicates no known allergies.  Home Medications   Prior to Admission medications   Medication Sig Start Date End Date Taking? Authorizing Provider  amLODipine (NORVASC) 10 MG tablet TAKE 1 TABLET DAILY  01/31/14   Darlyne Russian, MD  fish oil-omega-3 fatty acids 1000 MG capsule Take 2 capsules (2 g total) by mouth daily. 12/21/11   Darlyne Russian, MD  lisinopril (PRINIVIL,ZESTRIL) 20 MG tablet Take 1 tablet (20 mg total) by mouth 2 (two) times daily. 01/31/14   Darlyne Russian, MD  meloxicam (MOBIC) 15 MG tablet Take 1 a day with food 01/31/14   Darlyne Russian, MD  methocarbamol (ROBAXIN-750) 750 MG tablet Take 1 tablet (750 mg total) by mouth every 8 (eight) hours as needed for muscle spasms. 04/13/14   Darlyne Russian, MD  niacin (NIASPAN) 500 MG CR tablet Take 1 tablet (500 mg total) by mouth at bedtime. 01/31/14    Darlyne Russian, MD  predniSONE (DELTASONE) 10 MG tablet Take 4 a  day for 3 days 3 a  Day for 3 days 2 a day for 3 days one a day for 3 day 06/18/14   Darlyne Russian, MD  tamsulosin (FLOMAX) 0.4 MG CAPS capsule Take 0.4 mg by mouth 2 (two) times daily. 06/20/14   Historical Provider, MD  venlafaxine (EFFEXOR) 75 MG tablet Take 1 tablet (75 mg total) by mouth 2 (two) times daily. 01/31/14   Darlyne Russian, MD  Vitamin D, Ergocalciferol, (DRISDOL) 50000 UNITS CAPS Take 1 capsule (50,000 Units total) by mouth every 7 (seven) days. 01/19/13   Darlyne Russian, MD   BP 127/75 mmHg  Pulse 68  Temp(Src) 98.1 F (36.7 C) (Oral)  Resp 22  SpO2 98% Physical Exam  Constitutional: He is oriented to person, place, and time. He appears well-developed and well-nourished.  HENT:  Head: Normocephalic and atraumatic.  Mild dry mucous membranes  Eyes: Conjunctivae are normal. Right eye exhibits no discharge. Left eye exhibits no discharge.  Neck: Normal range of motion. Neck supple. No tracheal deviation present.  Cardiovascular: Normal rate and regular rhythm.   Pulmonary/Chest: Effort normal and breath sounds normal.  Abdominal: Soft. He exhibits no distension. There is no tenderness. There is no guarding.  Mild left lower flank pain difficult to localize abdomen soft no guarding.  Musculoskeletal: He exhibits no edema.  Neurological: He is alert and oriented to person, place, and time.  5+ strength in UE and LE with f/e at major joints. Sensation to palpation intact in UE and LE. CNs 2-12 grossly intact.  EOMFI.  PERRL.   Finger nose and coordination intact bilateral.   Visual fields intact to finger testing.   Skin: Skin is warm. No rash noted.  Psychiatric: He has a normal mood and affect.  Nursing note and vitals reviewed.   ED Course  Procedures (including critical care time) Labs Review Labs Reviewed  BASIC METABOLIC PANEL - Abnormal; Notable for the following:    Glucose, Bld 118 (*)     Creatinine, Ser 1.44 (*)    GFR calc non Af Amer 50 (*)    GFR calc Af Amer 57 (*)    All other components within normal limits  CBC - Abnormal; Notable for the following:    WBC 12.7 (*)    All other components within normal limits  TROPONIN I  URINALYSIS, ROUTINE W REFLEX MICROSCOPIC    Imaging Review No results found.   EKG Interpretation   Date/Time:  Sunday July 27 2014 16:57:01 EST Ventricular Rate:  57 PR Interval:  166 QRS Duration: 78 QT Interval:  398 QTC Calculation: 387 R Axis:   60 Text Interpretation:  Sinus bradycardia  with sinus arrhythmia Otherwise  normal ECG Confirmed by Laelani Vasko  MD, Riyad Keena ([id number]) on 07/27/2014 5:25:26 PM      MDM   Final diagnoses:  Seizure  Pulmonary nodule  Kidney stone on left side  ARF  Worsening flank pain intermittent concern for kidney stone however with age also concern for aorta, bedside ultrasound showed normal diameter abdominal aorta. CT scan ordered for further delineation as patient has not had this type of pain before.  Brief seizure activity no obvious cause at this time, CT head ordered, blood work, no signs of meningitis on exam.  CT abdomen pelvis showed left kidney stone 4 mm. Urinalysis showed hematuria consistent with kidney stone. Patient's pain improved and controlled on rechecks.  Coincidentally CT scan showed pulmonary nodule, CT chest confirmed to pulmonary nodules concerning for malignancy. Patient has history of prostate and skin cancer. Discussed the case with Dr.Gorsch to determine inpatient versus outpatient management, oncologist recommended outpatient management and to start with the [facility] pulmonology for biopsy and then follow-up with oncology. I had a long discussion with the patient and the wife guarding importance of follow-up with a specialist and strict reasons to return.  Patient had no further seizure activity in the ER. CT did not show a mass lesion, discussed possibility of small  metastasis/cancer that MRI or PET scan may show. Strict reasons to return for seizures given.  Results and differential diagnosis were discussed with the patient/parent/guardian. Close follow up outpatient was discussed, comfortable with the plan.   Medications  HYDROmorphone (DILAUDID) injection 1 mg (1 mg Intravenous Given 07/27/14 1752)  ondansetron (ZOFRAN) injection 4 mg (4 mg Intravenous Given 07/27/14 1752)  sodium chloride 0.9 % bolus 1,000 mL (0 mLs Intravenous Stopped 07/27/14 2018)  HYDROmorphone (DILAUDID) injection 0.5 mg (0.5 mg Intravenous Given 07/27/14 2018)    Filed Vitals:   07/27/14 1934 07/27/14 1945 07/27/14 2000 07/27/14 2126  BP: 126/80 131/75 129/68 122/75  Pulse: 63 61 63 60  Temp: 98.6 F (37 C)   98.7 F (37.1 C)  TempSrc: Oral   Oral  Resp: 26 21 29 23   SpO2: 95% 95% 94% 100%    Final diagnoses:  Seizure  Pulmonary nodule  Kidney stone on left side         Mariea Clonts, MD 07/27/14 2159

## 2014-07-28 ENCOUNTER — Telehealth: Payer: Self-pay | Admitting: *Deleted

## 2014-07-28 ENCOUNTER — Encounter (INDEPENDENT_AMBULATORY_CARE_PROVIDER_SITE_OTHER): Payer: Self-pay

## 2014-07-28 ENCOUNTER — Ambulatory Visit (INDEPENDENT_AMBULATORY_CARE_PROVIDER_SITE_OTHER): Payer: Medicare Other | Admitting: Internal Medicine

## 2014-07-28 ENCOUNTER — Telehealth: Payer: Self-pay

## 2014-07-28 ENCOUNTER — Encounter: Payer: Self-pay | Admitting: Internal Medicine

## 2014-07-28 ENCOUNTER — Other Ambulatory Visit (INDEPENDENT_AMBULATORY_CARE_PROVIDER_SITE_OTHER): Payer: Medicare Other

## 2014-07-28 VITALS — BP 130/80 | HR 65 | Ht 67.0 in | Wt 212.6 lb

## 2014-07-28 DIAGNOSIS — N320 Bladder-neck obstruction: Secondary | ICD-10-CM

## 2014-07-28 DIAGNOSIS — C61 Malignant neoplasm of prostate: Secondary | ICD-10-CM

## 2014-07-28 DIAGNOSIS — IMO0002 Reserved for concepts with insufficient information to code with codable children: Secondary | ICD-10-CM

## 2014-07-28 DIAGNOSIS — R918 Other nonspecific abnormal finding of lung field: Secondary | ICD-10-CM

## 2014-07-28 DIAGNOSIS — R911 Solitary pulmonary nodule: Secondary | ICD-10-CM

## 2014-07-28 DIAGNOSIS — N200 Calculus of kidney: Secondary | ICD-10-CM

## 2014-07-28 LAB — SEDIMENTATION RATE: Sed Rate: 16 mm/hr (ref 0–22)

## 2014-07-28 LAB — PSA: PSA: 0.12 ng/mL (ref 0.10–4.00)

## 2014-07-28 NOTE — Progress Notes (Signed)
   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   07/28/2014 1st [facility] office visit/ Wert   Chief Complaint  Patient presents with  . Pulmonary Consult    Referred by [facility] ED for eval of pulmonary nodules. Pt went to ED on 07/27/14 with left flank pain and pulmonary nodules found incidentally on ct abdomen/pelvis.   back pain developed while on RT for prostate ca indolent onset and progressive  in Feb 2015 and radiating down R leg all the way to foot better with norco.  Not limited by breathing from desired activities    No obvious day to day or daytime variabilty or assoc chronic cough or cp or chest tightness, subjective wheeze overt sinus or hb symptoms. No unusual exp hx or h/o childhood pna/ asthma or knowledge of premature birth.  Sleeping ok without nocturnal  or early am exacerbation  of respiratory  c/o's or need for noct saba. Also denies any obvious fluctuation of symptoms with weather or environmental changes or other aggravating or alleviating factors except as outlined above   Current Medications, Allergies, Complete Past Medical History, Past Surgical History, Family History, and Social History were reviewed in [employer] record.             Review of Systems  Constitutional: Negative for fever, chills, activity change, appetite change and unexpected weight change.  HENT: Positive for congestion and dental problem. Negative for postnasal drip, rhinorrhea, sneezing, sore throat, trouble swallowing and voice change.   Eyes: Negative for visual disturbance.  Respiratory: Negative for cough, choking and shortness of breath.   Cardiovascular: Negative for chest pain and leg  swelling.  Gastrointestinal: Positive for abdominal pain. Negative for nausea and vomiting.  Genitourinary: Negative for difficulty urinating.       Indigestion  Musculoskeletal: Positive for arthralgias.  Skin: Negative for rash.  Psychiatric/Behavioral: Negative for behavioral problems and confusion.       Objective:   Physical Exam amb wm nad  Wt Readings from Last 3 Encounters:  07/28/14 212 lb 9.6 oz (96.435 kg)  06/18/14 205 lb (92.987 kg)  01/31/14 209 lb (94.802 kg)    Vital signs reviewed  HEENT: nl dentition, turbinates, and orophanx. Nl external ear canals without cough reflex   NECK :  without JVD/Nodes/TM/ nl carotid upstrokes bilaterally   LUNGS: no acc muscle use, clear to A and P bilaterally without cough on insp or exp maneuvers   CV:  RRR  no s3 or murmur or increase in P2, no edema   ABD:  soft and nontender with nl excursion in the supine position. No bruits or organomegaly, bowel sounds nl  MS:  warm without deformities, calf tenderness, cyanosis or clubbing  SKIN: warm and dry without lesions    NEURO:  alert, approp, no deficits         Assessment & Plan:

## 2014-07-28 NOTE — Telephone Encounter (Signed)
Orders placed per Dr. Everlene Farrier- Pt had a CT scan for kidney stones resulting in an incidental finding of the pulmonary nodules.

## 2014-07-28 NOTE — Patient Instructions (Addendum)
Please see patient coordinator before you leave today  to schedule PET scan and I will call with your results   Please remember to go to the lab  department downstairs for your tests - we will call you with the results when they are available.

## 2014-07-28 NOTE — Telephone Encounter (Signed)
Pt calling to follow up with Dr. Everlene Farrier about appt with lung Dr. Quintella Baton that Dr. Everlene Farrier wanted to hear back from him today

## 2014-07-28 NOTE — Telephone Encounter (Signed)
FYI Dr. Everlene Farrier Pt has an appt with [name] on 1/7.

## 2014-07-29 ENCOUNTER — Telehealth: Payer: Self-pay | Admitting: Internal Medicine

## 2014-07-29 LAB — URINE CULTURE
CULTURE: NO GROWTH
Colony Count: NO GROWTH

## 2014-07-29 NOTE — Telephone Encounter (Signed)
Called and spoke with pts wife and she is aware of lab results per MW.  Nothing further is needed.

## 2014-07-29 NOTE — Progress Notes (Signed)
Quick Note:  LMTCB ______ 

## 2014-07-30 ENCOUNTER — Encounter: Payer: Self-pay | Admitting: Internal Medicine

## 2014-07-30 NOTE — Assessment & Plan Note (Signed)
Lab Results  Component Value Date   PSA 0.12 07/28/2014   PSA 0.10 01/31/2014   PSA 4.58* 01/19/2013     Therefore unlikely this is met ca

## 2014-07-30 NOTE — Assessment & Plan Note (Signed)
-   Pos exposure to [place] x 8 years  - See CT chest  07/27/14   PSA not elevated, this is a never smoker so posterior probability is quite low here but not zero  CT results reviewed with pt >>> borderline size  for PET or bx, not suspicious enough for excisional bx >  y option for now is follow the Fleischner society guidelines as rec by radiology or proceed with PET and patient wants to do the PET assuming his insurance company will approve it  See instructions for specific recommendations which were reviewed directly with the patient who was given a copy with highlighter outlining the key components.

## 2014-07-31 ENCOUNTER — Telehealth: Payer: Self-pay

## 2014-07-31 ENCOUNTER — Ambulatory Visit (HOSPITAL_COMMUNITY)
Admission: RE | Admit: 2014-07-31 | Discharge: 2014-07-31 | Disposition: A | Discharge status: Home / Self Care | Payer: Medicare Other | Source: Ambulatory Visit | Attending: Internal Medicine | Admitting: Internal Medicine

## 2014-07-31 DIAGNOSIS — N1330 Unspecified hydronephrosis: Secondary | ICD-10-CM | POA: Insufficient documentation

## 2014-07-31 DIAGNOSIS — K5730 Diverticulosis of large intestine without perforation or abscess without bleeding: Secondary | ICD-10-CM | POA: Diagnosis not present

## 2014-07-31 DIAGNOSIS — N210 Calculus in bladder: Secondary | ICD-10-CM | POA: Insufficient documentation

## 2014-07-31 DIAGNOSIS — I700 Atherosclerosis of aorta: Secondary | ICD-10-CM | POA: Diagnosis not present

## 2014-07-31 DIAGNOSIS — R599 Enlarged lymph nodes, unspecified: Secondary | ICD-10-CM | POA: Insufficient documentation

## 2014-07-31 DIAGNOSIS — R918 Other nonspecific abnormal finding of lung field: Secondary | ICD-10-CM | POA: Insufficient documentation

## 2014-07-31 DIAGNOSIS — I2510 Atherosclerotic heart disease of native coronary artery without angina pectoris: Secondary | ICD-10-CM | POA: Diagnosis not present

## 2014-07-31 LAB — GLUCOSE, CAPILLARY: GLUCOSE-CAPILLARY: 98 mg/dL (ref 70–99)

## 2014-07-31 LAB — HISTOPLASMA ANTIBODIES: Histoplasma Ab, Immunodiffusion: NEGATIVE

## 2014-07-31 MED ORDER — FLUDEOXYGLUCOSE F - 18 (FDG) INJECTION
10.5700 | Freq: Once | INTRAVENOUS | Status: AC | PRN
  Administered 2014-07-31: 10.57 via INTRAVENOUS

## 2014-07-31 NOTE — Progress Notes (Signed)
Quick Note:  Spoke with pt and notified of results per Dr. Wert. Pt verbalized understanding and denied any questions.  ______ 

## 2014-07-31 NOTE — Telephone Encounter (Signed)
Pt's wife called. States Dr. Everlene Farrier advised them to call and let him know that the test results were back and that he would return their call today. CB # [phone]

## 2014-07-31 NOTE — Progress Notes (Signed)
Quick Note:  LMTCB ______ 

## 2014-07-31 NOTE — Telephone Encounter (Signed)
Spoke to the nurse in regards to patient getting a nausea medication changed that was given to him by the hospital. I advise spouse per nurse have [facility] to contact the hospital and they should be able to get it authorized to change it to a medication that would be covered by the insurance. Patients wife was ok with this and stated she would have [facility] call.

## 2014-07-31 NOTE — Telephone Encounter (Signed)
Patients wife called from [facility] requesting Dr Everlene Farrier to call in a nausea medication for her husband. Per spouse he was just seen at the hospital for kidney stones. The hospital gave him a medication for nausea which is not covered by his insurance. Requesting our office to call in the generic brand of Phenergan. His wife stated she has already spoken to Dr Everlene Farrier and he is aware of this. She stated she was going to stay at the pharmacy until it was sent. She is requesting a call back to let her know it was sent

## 2014-07-31 NOTE — Telephone Encounter (Signed)
Pt called wanting

## 2014-07-31 NOTE — Progress Notes (Signed)
Quick Note:  3 month reminder was placed ______

## 2014-07-31 NOTE — Telephone Encounter (Signed)
Results of the PET scan are in Epic

## 2014-08-01 NOTE — Telephone Encounter (Signed)
Dr. Everlene Farrier-  I called and spoke to [facility]. They did get a call from Dr. Morrison Old office in regards to the results. They are not happy with his bedside manner. [name] stated to pt "your body lit up like a Christmas Tree" He had only stated that the results indicated that he must have inhaled something in the past. The office did do a test for histoplasmosis and it was negative. Dr. Gustavus Bryant office did not have any further recommendations other than to wait 6 months.   Pt and wife are very concerned about some of the other findings mentioned in this result. They have access to view also.   Advised wife this was a conversation better had face to face. Pt and wife are RTC to see Dr. Everlene Farrier tomorrow morning.

## 2014-08-01 NOTE — Telephone Encounter (Signed)
Call patient and let him know the results are in. He can call Dr. Gustavus Bryant office and get the results from them. Since I am not the ordering physician I cannot give him the results. I  can explain the findings on the scan with them but only after [name] has reviewed that with him.

## 2014-08-02 ENCOUNTER — Ambulatory Visit (INDEPENDENT_AMBULATORY_CARE_PROVIDER_SITE_OTHER): Payer: Medicare Other | Admitting: Emergency Medicine

## 2014-08-02 VITALS — BP 110/64 | HR 68 | Temp 98.5°F | Resp 16 | Ht 67.75 in | Wt 210.0 lb

## 2014-08-02 DIAGNOSIS — R591 Generalized enlarged lymph nodes: Secondary | ICD-10-CM

## 2014-08-02 DIAGNOSIS — I2510 Atherosclerotic heart disease of native coronary artery without angina pectoris: Secondary | ICD-10-CM

## 2014-08-02 DIAGNOSIS — I2584 Coronary atherosclerosis due to calcified coronary lesion: Secondary | ICD-10-CM

## 2014-08-02 DIAGNOSIS — R599 Enlarged lymph nodes, unspecified: Secondary | ICD-10-CM

## 2014-08-02 DIAGNOSIS — N200 Calculus of kidney: Secondary | ICD-10-CM

## 2014-08-02 DIAGNOSIS — R942 Abnormal results of pulmonary function studies: Secondary | ICD-10-CM

## 2014-08-02 LAB — POCT CBC
GRANULOCYTE PERCENT: 72.7 % (ref 37–80)
HCT, POC: 43 % — AB (ref 43.5–53.7)
HEMOGLOBIN: 14.6 g/dL (ref 14.1–18.1)
Lymph, poc: 1.7 (ref 0.6–3.4)
MCH: 31.1 pg (ref 27–31.2)
MCHC: 34 g/dL (ref 31.8–35.4)
MCV: 91.6 fL (ref 80–97)
MID (CBC): 0.7 (ref 0–0.9)
MPV: 6.6 fL (ref 0–99.8)
POC Granulocyte: 0.7 — AB (ref 2–6.9)
POC LYMPH PERCENT: 19.5 %L (ref 10–50)
POC MID %: 7.8 % (ref 0–12)
Platelet Count, POC: 206 10*3/uL (ref 142–424)
RBC: 4.7 M/uL (ref 4.69–6.13)
RDW, POC: 12.8 %
WBC: 8.7 10*3/uL (ref 4.6–10.2)

## 2014-08-02 LAB — SEDIMENTATION RATE: Sed Rate: 28 mm/hr — ABNORMAL HIGH (ref 0–16)

## 2014-08-02 NOTE — Addendum Note (Signed)
Addended byGrant Fontana R on: 08/02/2014 10:17 AM   Modules accepted: Orders

## 2014-08-02 NOTE — Progress Notes (Signed)
This chart was scribed for Nena Jordan, MD by Einar Pheasant, ED Scribe. This patient was seen in room 12 and the patient's care was started at 8:57 AM.  Subjective:    Patient ID: [name], male    DOB: 21-Jan-1951, 64 y.o.   MRN: [id number]  Chief Complaint  Patient presents with  . discuss test results    Pet scan     HPI [name] is a 64 y.o. male with a PMhx of Multiple pulmonary nodules, bladder outlet obstruction, prostate CA, diverticulosis of colon, lumbar disc disease, and adenomatous colonic polyp.   Today, pt present to the office seeking to discuss the results of his PET scan which was schedule on 07/31/2014. While he was discussing his results with his doctor he was told to wait 3-4 months. However, after reading the results of the PET scan they were a bit alarms by the findings that were listed on the print out.   Patient Active Problem List   Diagnosis Date Noted  . Multiple pulmonary nodules 07/28/2014  . Bladder outlet obstruction 07/28/2014  . Prostate cancer 01/31/2014  . Lumbar disc disease 01/31/2014  . ADENOMATOUS COLONIC POLYP 01/10/2007  . DIVERTICULOSIS OF COLON 01/10/2007   Past Medical History  Diagnosis Date  . Pure hyperglyceridemia   . Colon polyps   . Sleep apnea     wears CPAP  . Hypertension   . Kidney stones   . Allergy   . Arthritis   . Cataract   . Depression     no per pt  . Neuromuscular disorder     pinched nerve right arm  . Prostate cancer    Past Surgical History  Procedure Laterality Date  . Retinal tear repair cryotherapy      right  . Knee surgery      both knees done  . Kidney stone surgery    . Tonsillectomy and adenoidectomy    . Cataract extraction      left  . Colonoscopy    . Eye surgery     No Known Allergies Prior to Admission medications   Medication Sig Start Date End Date Taking? Authorizing Provider  amLODipine (NORVASC) 10 MG tablet TAKE 1 TABLET DAILY Patient taking differently: Take  5 mg by mouth daily.  01/31/14  Yes Darlyne Russian, MD  aspirin EC 81 MG tablet Take 81 mg by mouth daily.   Yes Historical Provider, MD  cephALEXin (KEFLEX) 500 MG capsule Take 1 capsule (500 mg total) by mouth 2 (two) times daily. 07/27/14  Yes Mariea Clonts, MD  cholecalciferol (VITAMIN D) 1000 UNITS tablet Take 1,000 Units by mouth daily.   Yes Historical Provider, MD  Cyanocobalamin (VITAMIN B-12 PO) Take 1 tablet by mouth daily.   Yes Historical Provider, MD  fish oil-omega-3 fatty acids 1000 MG capsule Take 2 capsules (2 g total) by mouth daily. 12/21/11  Yes Darlyne Russian, MD  fluticasone (FLONASE) 50 MCG/ACT nasal spray Place 1 spray into both nostrils daily as needed (congestion).   Yes Historical Provider, MD  HYDROcodone-acetaminophen (NORCO) 5-325 MG per tablet Take 2 tablets by mouth every 4 (four) hours as needed. 07/27/14  Yes Mariea Clonts, MD  Inositol Niacinate (NIACIN FLUSH FREE) 500 MG CAPS Take 500 mg by mouth 2 (two) times daily.   Yes Historical Provider, MD  lisinopril (PRINIVIL,ZESTRIL) 20 MG tablet Take 1 tablet (20 mg total) by mouth 2 (two) times daily. Patient taking differently: Take 10  mg by mouth daily.  01/31/14  Yes Darlyne Russian, MD  meloxicam (MOBIC) 15 MG tablet Take 1 a day with food 01/31/14  Yes Darlyne Russian, MD  methocarbamol (ROBAXIN-750) 750 MG tablet Take 1 tablet (750 mg total) by mouth every 8 (eight) hours as needed for muscle spasms. 04/13/14  Yes Darlyne Russian, MD  niacin (NIASPAN) 500 MG CR tablet Take 1 tablet (500 mg total) by mouth at bedtime. 01/31/14  Yes Darlyne Russian, MD  ondansetron (ZOFRAN ODT) 4 MG disintegrating tablet 4mg  ODT q4 hours prn nausea/vomit 07/27/14  Yes Mariea Clonts, MD  predniSONE (DELTASONE) 10 MG tablet Take 4 a  day for 3 days 3 a  Day for 3 days 2 a day for 3 days one a day for 3 day 06/18/14  Yes Darlyne Russian, MD  tamsulosin (FLOMAX) 0.4 MG CAPS capsule Take 0.4 mg by mouth 2 (two) times daily. 06/20/14  Yes Historical  Provider, MD  Tetrahydrozoline HCl (VISINE OP) Place 1 drop into both eyes daily as needed (red eyes).   Yes Historical Provider, MD  venlafaxine (EFFEXOR) 75 MG tablet Take 1 tablet (75 mg total) by mouth 2 (two) times daily. 01/31/14  Yes Darlyne Russian, MD  Vitamin D, Ergocalciferol, (DRISDOL) 50000 UNITS CAPS Take 1 capsule (50,000 Units total) by mouth every 7 (seven) days. 01/19/13  Yes Darlyne Russian, MD   History   Social History  . Marital Status: Married    Spouse Name: N/A    Number of Children: 3  . Years of Education: N/A   Occupational History  . Football coach    Social History Main Topics  . Smoking status: Never Smoker   . Smokeless tobacco: Never Used  . Alcohol Use: Yes     Comment: social  . Drug Use: No  . Sexual Activity: Not on file   Other Topics Concern  . Not on file   Social History Narrative    Review of Systems  Constitutional: Negative for fatigue and unexpected weight change.  Eyes: Negative for visual disturbance.  Respiratory: Negative for cough, chest tightness and shortness of breath.   Cardiovascular: Negative for chest pain, palpitations and leg swelling.  Gastrointestinal: Negative for abdominal pain and blood in stool.  Neurological: Negative for dizziness, light-headedness and headaches.       Objective:   Physical Exam  Constitutional: He appears well-developed and well-nourished. No distress.  HENT:  Head: Normocephalic and atraumatic.  Right Ear: External ear normal.  Left Ear: External ear normal.  Mouth/Throat: Oropharynx is clear and moist. No oropharyngeal exudate.  Eyes: Conjunctivae are normal. Right eye exhibits no discharge. Left eye exhibits no discharge.  Neck: Neck supple. No thyromegaly present.  Cardiovascular: Normal rate, regular rhythm and normal heart sounds.  Exam reveals no gallop and no friction rub.   No murmur heard. Pulmonary/Chest: Effort normal and breath sounds normal. No respiratory distress.    Abdominal: Soft. He exhibits no distension. There is no hepatosplenomegaly, splenomegaly or hepatomegaly. There is no tenderness.  Musculoskeletal: He exhibits no edema or tenderness.  Lymphadenopathy:    He has no cervical adenopathy.    He has no axillary adenopathy.  Neurological: He is alert.  Skin: Skin is warm and dry.  Psychiatric: He has a normal mood and affect. His behavior is normal. Thought content normal.  Nursing note and vitals reviewed.    Filed Vitals:   08/02/14 0847  BP: 110/64  Pulse:  68  Temp: 98.5 F (36.9 C)  TempSrc: Oral  Resp: 16  Height: 5' 7.75" (1.721 m)  Weight: 210 lb (95.255 kg)  SpO2: 96%   Results for orders placed or performed in visit on 08/02/14  POCT CBC  Result Value Ref Range   WBC 8.7 4.6 - 10.2 K/uL   Lymph, poc 1.7 0.6 - 3.4   POC LYMPH PERCENT 19.5 10 - 50 %L   MID (cbc) 0.7 0 - 0.9   POC MID % 7.8 0 - 12 %M   POC Granulocyte 0.7 (A) 2 - 6.9   Granulocyte percent 72.7 37 - 80 %G   RBC 4.70 4.69 - 6.13 M/uL   Hemoglobin 14.6 14.1 - 18.1 g/dL   HCT, POC 43.0 (A) 43.5 - 53.7 %   MCV 91.6 80 - 97 fL   MCH, POC 31.1 27 - 31.2 pg   MCHC 34.0 31.8 - 35.4 g/dL   RDW, POC 12.8 %   Platelet Count, POC 206 142 - 424 K/uL   MPV 6.6 0 - 99.8 fL       Assessment & Plan:  We'll proceed with evaluation by Dr. Lutricia Feil with attention to the lymphadenopathy noted on PET scan. He will continue his follow-up with Dr. Shyrl Numbers. I did go ahead and and do a sedimentation rate and ace level is recommended by the radiologist. Appreciate Dr. Shyrl Numbers looking after [name].I personally performed the services described in this documentation, which was scribed in my presence. The recorded information has been reviewed and is accurate.

## 2014-08-03 ENCOUNTER — Telehealth: Payer: Self-pay

## 2014-08-03 NOTE — Telephone Encounter (Signed)
Patient of Dr. Everlene Farrier. Wife states he was seen yesterday (Saturday) and they forgot to ask about kidney stones and constipation. Helene Kelp says that he's been passing stones since last Sunday and the patient has tried laxatives for the constipation. They want to know what to do. cb# [phone]

## 2014-08-03 NOTE — Telephone Encounter (Signed)
Pt's wife notified about the Miralax.  Pt does have an appt with Uro 08/08/14. He just wanted to let you know that he passed some kidney stones yesterday, but hasn't had any problems today.

## 2014-08-03 NOTE — Telephone Encounter (Signed)
Left message on machine to call back. Per Dr. Everlene Farrier, pt can take Miralax for constipation and we did a referral for him for Urology. If he has seen Uro, f/u with them about his kidney stones. If he hasn't, send to referrals to have them check on it for [place]. Thanks

## 2014-08-04 ENCOUNTER — Ambulatory Visit: Payer: Medicare Other | Admitting: Internal Medicine

## 2014-08-04 LAB — ANGIOTENSIN CONVERTING ENZYME: Angiotensin-Converting Enzyme: 5 U/L — ABNORMAL LOW (ref 8–52)

## 2014-08-05 ENCOUNTER — Telehealth: Payer: Self-pay | Admitting: Hematology & Oncology

## 2014-08-05 LAB — IMMUNOFIXATION ELECTROPHORESIS
IGG (IMMUNOGLOBIN G), SERUM: 625 mg/dL — AB (ref 650–1600)
IGM, SERUM: 50 mg/dL (ref 41–251)
IgA: 200 mg/dL (ref 68–379)
Total Protein, Serum Electrophoresis: 6 g/dL (ref 6.0–8.3)

## 2014-08-05 LAB — PROTEIN ELECTROPHORESIS, SERUM
ALBUMIN ELP: 56.3 % (ref 55.8–66.1)
Alpha-1-Globulin: 7.6 % — ABNORMAL HIGH (ref 2.9–4.9)
Alpha-2-Globulin: 12.8 % — ABNORMAL HIGH (ref 7.1–11.8)
BETA 2: 5.6 % (ref 3.2–6.5)
Beta Globulin: 6.4 % (ref 4.7–7.2)
Gamma Globulin: 11.3 % (ref 11.1–18.8)
Total Protein, Serum Electrophoresis: 6 g/dL (ref 6.0–8.3)

## 2014-08-05 NOTE — Telephone Encounter (Signed)
Rick spoke w NEW PATIENT today to remind them of their appointment with Dr. Marin Olp. Also, advised them to bring all medication bottles and insurance card information.

## 2014-08-06 ENCOUNTER — Other Ambulatory Visit: Payer: Medicare Other | Admitting: Lab

## 2014-08-06 ENCOUNTER — Ambulatory Visit: Payer: Medicare Other

## 2014-08-06 ENCOUNTER — Ambulatory Visit (HOSPITAL_BASED_OUTPATIENT_CLINIC_OR_DEPARTMENT_OTHER): Payer: Medicare Other | Admitting: Hematology & Oncology

## 2014-08-06 ENCOUNTER — Encounter: Payer: Self-pay | Admitting: Hematology & Oncology

## 2014-08-06 VITALS — BP 123/75 | HR 73 | Temp 98.3°F | Resp 18 | Ht 66.0 in | Wt 202.0 lb

## 2014-08-06 DIAGNOSIS — Z85820 Personal history of malignant melanoma of skin: Secondary | ICD-10-CM

## 2014-08-06 DIAGNOSIS — R590 Localized enlarged lymph nodes: Secondary | ICD-10-CM

## 2014-08-06 DIAGNOSIS — B49 Unspecified mycosis: Secondary | ICD-10-CM

## 2014-08-06 DIAGNOSIS — R599 Enlarged lymph nodes, unspecified: Secondary | ICD-10-CM

## 2014-08-06 DIAGNOSIS — Z8546 Personal history of malignant neoplasm of prostate: Secondary | ICD-10-CM

## 2014-08-06 DIAGNOSIS — R918 Other nonspecific abnormal finding of lung field: Secondary | ICD-10-CM

## 2014-08-06 NOTE — Progress Notes (Signed)
Referral MD  Reason for Referral: Lymphadenopathy and pulmonary nodules   Chief Complaint  Patient presents with  . NEW PATIENT  : I have a possible problem and I was referred to you for advice.  HPI: [name] is a really nice 64 year old gentleman. 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  He is moved around quite a bit.  He has a history of prostate cancer. This was treated with stereotactic radiosurgery down in [place]. This was a couple years ago.  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 This was on the right side of his face. It was in the pre--auricular region. This was back in the summer. He had Mohs surgery for this. I don't have any records on the results.  About a month or so ago, he had recurrent kidney stones. He went to the emergency room. He had a CT scan done. This showed that there were some nodules in his lung at the lung base.  He then had a dedicated CT scan of the chest. This was on January 3. This showed a 1 cm right middle lobe and one similar left upper lobe nodule. There is no mention of pathologic adenopathy although he was noted to have "numerous" mediastinal lymph nodes.  He was sent to pulmonary medicine. They felt that this probably was histoplasmosis. However, the blood test for this came back negative.  He was then seen by his family doctor, Dr. Everlene Farrier. Dr. Everlene Farrier did a PET scan on him. Shocking enough, this showed active lymph nodes not only in the mediastinum but also in the abdomen and pelvis. Some of his lymph nodes had significant activity. It was felt that this may have been a lymphoproliferative disorder. Also, sarcoidosis was a possibility.  Dr. Everlene Farrier kindly referred [name] to the [facility] for an evaluation.  He has never smoked. He's had secondhand tobacco exposure. He has some social alcohol use.  He's had no cough. He's had no fever. He's had no sweats. He's had no weight loss  or weight gain. He's had no change in bowel or bladder habits. He's had no rashes. He's had no arm or leg swelling.  overall, he has been pretty active. His performance status is ECOG 1        Past Medical History  Diagnosis Date  . Pure hyperglyceridemia   . Colon polyps   . Sleep apnea     wears CPAP  . Hypertension   . Kidney stones   . Allergy   . Arthritis   . Cataract   . Depression     no per pt  . Neuromuscular disorder     pinched nerve right arm  . Prostate cancer   :  Past Surgical History  Procedure Laterality Date  . Retinal tear repair cryotherapy      right  . Knee surgery      both knees done  . Kidney stone surgery    . Tonsillectomy and adenoidectomy    . Cataract extraction      left  . Colonoscopy    . Eye surgery    :   Current outpatient prescriptions:  .  amLODipine (NORVASC) 10 MG tablet, TAKE 1 TABLET DAILY (Patient taking differently: Take 5 mg by mouth daily. Takes 1/2 tab daily), Disp: 90 tablet, Rfl: 3 .  aspirin EC 81 MG tablet, Take 81 mg by mouth daily., Disp: , Rfl:  .  cholecalciferol (VITAMIN D) 1000 UNITS tablet, Take 1,000 Units by mouth daily., Disp: , Rfl:  .  Cyanocobalamin (VITAMIN B-12 PO), Take 1 tablet by mouth daily., Disp: , Rfl:  .  fluticasone (FLONASE) 50 MCG/ACT nasal spray, Place 1 spray into both nostrils daily as needed (congestion)., Disp: , Rfl:  .  lisinopril (PRINIVIL,ZESTRIL) 20 MG tablet, Take 1 tablet (20 mg total) by mouth 2 (two) times daily. (Patient taking differently: Take 10 mg by mouth daily. ), Disp: 180 tablet, Rfl: 3 .  niacin (NIASPAN) 500 MG CR tablet, Take 1 tablet (500 mg total) by mouth at bedtime., Disp: 90 tablet, Rfl: 3 .  tamsulosin (FLOMAX) 0.4 MG CAPS capsule, Take 0.4 mg by mouth 2 (two) times daily., Disp: , Rfl: 5 .  venlafaxine (EFFEXOR) 75 MG tablet, Take 1 tablet (75 mg total) by mouth 2 (two) times daily., Disp: 180 tablet, Rfl: 3 .  Vitamin D, Ergocalciferol, (DRISDOL) 50000  UNITS CAPS, Take 1 capsule (50,000 Units total) by mouth every 7 (seven) days., Disp: 30 capsule, Rfl: 1:  :  No Known Allergies:  Family History  Problem Relation Age of Onset  . Clotting disorder Mother   . Colon polyps Father   . Diabetes Father   . Colon cancer Neg Hx   . Esophageal cancer Neg Hx   . Stomach cancer Neg Hx   . Rectal cancer Neg Hx   :  History   Social History  . Marital Status: Married    Spouse Name: N/A    Number of Children: 3  . Years of Education: N/A   Occupational History  . Football coach    Social History Main Topics  . Smoking status: Never Smoker   . Smokeless tobacco: Never Used  . Alcohol Use: Yes     Comment: social  . Drug Use: No  . Sexual Activity: Not on file   Other Topics Concern  . Not on file   Social History Narrative  :  Pertinent items are noted in HPI.  Exam: @IPVITALS @  well-developed and well-nourished white gentleman in no obvious distress. Vital signs show temperature of 98.3. Pulse 73. Blood pressure 123/75. Weight is 202 pounds. Head and neck exam shows normocephalic atraumatic skull. He has no ocular or oral lesions. No obvious lymphadenopathy is noted in the neck. There may be some slight fullness in the left supraclavicular region. Lungs are clear. He has no rales, wheezes or rhonchi. Cardiac exam regular rate and rhythm with no murmurs, rubs or bruits. Axillary exam does show some bilateral axillary lymph nodes. These are high up in the axilla. They probably measure about 2 cm. They are nontender and mobile. Abdomen is soft. He has good bowels out. There is no fluid wave. There is no palpable liver or spleen tip. Inguinal exam shows no obvious inguinal lymph nodes bilaterally. Back exam no tenderness over the spine, ribs or hips. Extremities shows no clubbing, cyanosis or edema.   No results for input(s): WBC, HGB, HCT, PLT in the last 72 hours. No results for input(s): NA, K, CL, CO2, GLUCOSE, BUN, CREATININE,  CALCIUM in the last 72 hours.  Blood smear review: None  Pathology: None     Assessment and Plan: [name] is a very nice 64 year old gentleman. He has lymphadenopathy. Lymph nodes are active on PET scan.  I spent about an hour with he and his wife. I explained to them what thought might be going on. It is certainly possible that  the lymph nodes are reactive. Sarcoidosis is a possibility. He does still have some type of fungal infection. Aspergillosis is a was a possibility.  I think if one were looking at malignancy, then you would have to think about his melanoma or possibly lymphoma. I don't think prostate cancer is the problem. A PSA on him a week or so ago was 0.12.  Of note, he had a normal angiotensin converting enzyme level of 5 so I would not think that sarcoid is likely.  And M spike was not found.  I think that we are going to have to get a biopsy. I would favor a mediastinoscopy as this could actually get lymph nodes removed and enough tissue could be sampled to see if there is a malignancy.  I don't think and endoscopic bronchial ultrasound would give [place] enough tissue.  I will refer him to [name] of thoracic surgery for an evaluation.  Although he does have the palpable lymph nodes in the axilla but nothing was noted on PET scan so I would not favor biopsying these.  We will have to see what a lymph node biopsy shows. Again, I think a mediastinoscopy would be the best way of trying to get enough tissue for evaluation.  I will plan to see him back once I get the results.

## 2014-08-08 ENCOUNTER — Institutional Professional Consult (permissible substitution) (INDEPENDENT_AMBULATORY_CARE_PROVIDER_SITE_OTHER): Payer: Medicare Other | Admitting: Thoracic Surgery (Cardiothoracic Vascular Surgery)

## 2014-08-08 ENCOUNTER — Other Ambulatory Visit: Payer: Self-pay | Admitting: *Deleted

## 2014-08-08 ENCOUNTER — Encounter: Payer: Medicare Other | Admitting: Thoracic Surgery (Cardiothoracic Vascular Surgery)

## 2014-08-08 VITALS — BP 125/86 | HR 64 | Resp 16 | Ht 68.0 in | Wt 204.0 lb

## 2014-08-08 DIAGNOSIS — Z9889 Other specified postprocedural states: Secondary | ICD-10-CM

## 2014-08-08 DIAGNOSIS — R590 Localized enlarged lymph nodes: Secondary | ICD-10-CM

## 2014-08-08 DIAGNOSIS — Z85820 Personal history of malignant melanoma of skin: Secondary | ICD-10-CM

## 2014-08-08 DIAGNOSIS — R599 Enlarged lymph nodes, unspecified: Secondary | ICD-10-CM

## 2014-08-08 NOTE — Progress Notes (Signed)
PCP is DAUB, Lina Sayre, MD Referring Provider is Volanda Napoleon, MD  Chief Complaint  Patient presents with  . Adenopathy    mediastinal....CT CHEST/PET    HPI: 64 year old man sent for consultation regarding mediastinal adenopathy.  Mr. Freiberger is a 64 year old gentleman who is a lifelong nonsmoker. 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 He was in his usual state of health until January 3. He experienced severe back pain which led to him going to the emergency room. Apparently at one point even had a seizure. He turned out to have kidney stones. A CT of the abdomen and pelvis was done. It demonstrated the kidney stone in the left ureter. It also showed a 10 mm nodule in the right middle lobe. A CT of the chest was done which showed bilateral lung nodules and also hilar and mediastinal adenopathy. He saw both [name] and Dr. Marin Olp in addition to Dr. Everlene Farrier during this time. A PET CT showed markedly hypermetabolic hilar and mediastinal adenopathy. There were several other scattered lymph nodes that were hypermetabolic including inguinal nodes, supraclavicular node and a retroperitoneal node.   He had back and chest discomfort with his kidney stone. He denies fevers, chills, or night sweats. He has no known exposure to tuberculosis. He has not traveled out of the country or to the [facility]. He doesn't have any recent exposure to birds. He has lost about 6 pounds over the past 3 months and 11 pounds over the past 6 months due to diet and exercise. His appetite remains good. He remains very physically active.  Zubrod Score: At the time of surgery this patient's most appropriate activity status/level should be described as: [x]     0    Normal activity, no symptoms []     1    Restricted in physical strenuous activity but ambulatory, able to do out light work []     2    Ambulatory and capable of  self care, unable to do work activities, up and about >50 % of waking hours                              []     3    Only limited self care, in bed greater than 50% of waking hours []     4    Completely disabled, no self care, confined to bed or chair []     5    Moribund   Past Medical History  Diagnosis Date  . Pure hyperglyceridemia   . Colon polyps   . Sleep apnea     wears CPAP  . Hypertension   . Kidney stones   . Allergy   . Arthritis   . Cataract   . Depression     no per pt  . Neuromuscular disorder     pinched nerve right arm  . Prostate cancer     Past Surgical History  Procedure Laterality Date  . Retinal tear repair cryotherapy      right  . Knee surgery      both knees done  . Kidney stone surgery    . Tonsillectomy and adenoidectomy    . Cataract extraction      left  . Colonoscopy    . Eye surgery      Family History  Problem Relation Age  of Onset  . Clotting disorder Mother   . Colon polyps Father   . Diabetes Father   . Colon cancer Neg Hx   . Esophageal cancer Neg Hx   . Stomach cancer Neg Hx   . Rectal cancer Neg Hx     Social History History  Substance Use Topics  . Smoking status: Never Smoker   . Smokeless tobacco: Never Used  . Alcohol Use: Yes     Comment: social    Current Outpatient Prescriptions  Medication Sig Dispense Refill  . amLODipine (NORVASC) 10 MG tablet TAKE 1 TABLET DAILY (Patient taking differently: Take 5 mg by mouth daily. Takes 1/2 tab daily) 90 tablet 3  . aspirin EC 81 MG tablet Take 81 mg by mouth daily.    . cholecalciferol (VITAMIN D) 1000 UNITS tablet Take 1,000 Units by mouth daily.    . Cyanocobalamin (VITAMIN B-12 PO) Take 1 tablet by mouth daily.    . fluticasone (FLONASE) 50 MCG/ACT nasal spray Place 1 spray into both nostrils daily as needed (congestion).    Marland Kitchen lisinopril (PRINIVIL,ZESTRIL) 20 MG tablet Take 1 tablet (20 mg total) by mouth 2 (two) times daily. (Patient taking differently: Take 10 mg  by mouth daily. ) 180 tablet 3  . niacin (NIASPAN) 500 MG CR tablet Take 1 tablet (500 mg total) by mouth at bedtime. 90 tablet 3  . tamsulosin (FLOMAX) 0.4 MG CAPS capsule Take 0.4 mg by mouth 2 (two) times daily.  5  . venlafaxine (EFFEXOR) 75 MG tablet Take 1 tablet (75 mg total) by mouth 2 (two) times daily. 180 tablet 3  . Vitamin D, Ergocalciferol, (DRISDOL) 50000 UNITS CAPS Take 1 capsule (50,000 Units total) by mouth every 7 (seven) days. 30 capsule 1   No current facility-administered medications for this visit.    No Known Allergies  Review of Systems  Constitutional: Negative for fever, chills, activity change, fatigue and unexpected weight change (has lost 6 pounds last 3 months- diet).  Respiratory: Positive for apnea (uses CPAP at night).   Cardiovascular: Positive for chest pain.  Gastrointestinal: Positive for constipation.  Genitourinary:       Hx prostate cancer, kidney stones  Musculoskeletal: Positive for arthralgias.  Neurological: Positive for seizures (July 27, 2014, at time of kidney stone).  Hematological: Positive for adenopathy. Does not bruise/bleed easily.  All other systems reviewed and are negative.   BP 125/86 mmHg  Pulse 64  Resp 16  Ht 5\' 8"  (1.727 m)  Wt 204 lb (92.534 kg)  BMI 31.03 kg/m2  SpO2 98% Physical Exam  Constitutional: He is oriented to person, place, and time. He appears well-developed and well-nourished. No distress.  HENT:  Head: Normocephalic and atraumatic.  Well healed Moh's site anterior to right ear  Eyes: EOM are normal. Pupils are equal, round, and reactive to light.  Neck: Neck supple. No thyromegaly present.  Cardiovascular: Normal rate, regular rhythm, normal heart sounds and intact distal pulses.  Exam reveals no gallop and no friction rub.   No murmur heard. Pulmonary/Chest: Effort normal and breath sounds normal. He has no wheezes. He has no rales.  Questionable left axillary node  Abdominal: Soft. Bowel sounds  are normal.  Musculoskeletal: He exhibits no edema.  Lymphadenopathy:    He has no cervical adenopathy.  Neurological: He is alert and oriented to person, place, and time. No cranial nerve deficit.  Skin: Skin is warm and dry.  Vitals reviewed.    Diagnostic Tests: CT  CHEST WITHOUT CONTRAST  TECHNIQUE: Multidetector CT imaging of the chest was performed following the standard protocol without IV contrast.  COMPARISON: 07/27/2014 CT abdomen pelvis  FINDINGS: There are numerous mediastinal lymph nodes including an 12 mm aortopulmonary window lymph node, 15 mm precarinal lymph node, 12 mm paratracheal lymph node, and 17 mm subcarinal lymph node. The bilateral hila are not evaluated well without contrast.  There is left anterior descending artery calcification. Heart size is normal. No pleural or pericardial effusion.  Thoracic inlet is normal. Thyroid is normal.  10 mm right middle lobe with irregular borders as described on CT performed earlier today. No additional nodules on the right. Left lung demonstrates a 10 mm nodule posteriorly in the inferior aspect of the left upper lobe. There is mild bilateral lower lobe atelectasis.  Thoracic skeletal structures intact.  IMPRESSION: 1 cm right middle lobe and 1 cm left upper lobe pulmonary nodules concerning for malignancy. These could be further evaluated with PET scan.   Electronically Signed  By: Skipper Cliche M.D.  On: 07/27/2014 20:55  NUCLEAR MEDICINE PET SKULL BASE TO THIGH  TECHNIQUE: 10.6 mCi F-18 FDG was injected intravenously. Full-ring PET imaging was performed from the skull base to thigh after the radiotracer. CT data was obtained and used for attenuation correction and anatomic localization.  FASTING BLOOD GLUCOSE: Value: 98 mg/dl  COMPARISON: Chest CT 07/27/2014.  FINDINGS: NECK  Nonenlarged mildly hypermetabolic right supraclavicular lymph node (SUVmax =  3.7).  CHEST  The previously described 1 cm right upper lobe pulmonary nodule demonstrates only low-level metabolic activity (SUVmax = 1.3). The previously described left upper lobe pulmonary nodule also demonstrates low-level metabolic activity (SUVmax = 2.3). Both of these nodules of but the fissures. There is also a 3 mm subpleural nodule in the periphery of the left upper lobe (image 26 of series 6). Multiple borderline enlarged and mildly enlarged lymph nodes in the mediastinal and hilar stations are diffusely hypermetabolic. Specific examples include a 1.3 cm short axis high right paratracheal lymph node (SUVmax = 12), 1.6 cm short axis lower right paratracheal lymph node (SUVmax = 8.4), and a cluster of lymph nodes in the subcarinal nodal station measuring up to 1.4 cm in short axis (SUVmax = 12.7). Heart size is normal. There is no significant pericardial fluid, thickening or pericardial calcification. There is atherosclerosis of the thoracic aorta, the great vessels of the mediastinum and the coronary arteries, including calcified atherosclerotic plaque in the left anterior descending coronary artery. Faint calcifications of the aortic valve.  ABDOMEN/PELVIS  No abnormal hypermetabolic activity within the liver, pancreas, adrenal glands, or spleen. Multiple borderline enlarged and mildly enlarged upper abdominal lymph nodes, including a 14 mm short axis hepatoduodenal ligament lymph node (SUVmax = 7.2), and a 10 x 41 mm portacaval lymph node (SUVmax = 9.0). Several nonobstructive calculi are present within the collecting systems of the kidneys bilaterally, largest of which measures up to 5 mm in the interpolar collecting system of the left kidney. There are two 2-3 mm calculi present within the urinary bladder. In addition, at the left ureterovesicular junction there is a 5 mm calculus (image 191 of series 4). This is associated with mild proximal  left hydroureteronephrosis and left perinephric stranding, indicative of obstruction at this time. Numerous colonic diverticulae are present, particularly in the sigmoid colon, without surrounding inflammatory changes to suggest an acute diverticulitis at this time. Fiducial markers are noted in the region of the prostate gland. Multiple borderline enlarged and mildly enlarged  bilateral external iliac, inguinal and femoral nodes are noted bilaterally, which demonstrate hypermetabolism. The largest of these measures up to 11 mm in the right superficial femoral nodal station (SUVmax = 8.6).  SKELETON  No focal hypermetabolic activity to suggest skeletal metastasis.  IMPRESSION: 1. Extensive hypermetabolic lymphadenopathy noted in the right supraclavicular region, chest, abdomen and pelvis, as detailed above. Findings are concerning for potential lymphoproliferative disorder. However, given the subpleural nodules in the lungs bilaterally, another differential consideration would include a systemic disease such as sarcoidosis. Clinical correlation is recommended, with consideration for bronchoscopy and nodal sampling of the mediastinal and/or hilar lymph nodes if clinically appropriate. Less likely, the pulmonary nodules may represent primary bronchogenic malignancies. Regardless of the findings at bronchoscopy, repeat noncontrast chest CT 3-6 months is recommended to evaluate the stability of the pulmonary nodules. 2. 5 mm partially obstructive calculus at the left ureterovesicular junction with mild proximal left hydroureteronephrosis. Multiple nonobstructive calculi are present within the collecting systems of the kidneys bilaterally, and 2 additional tiny 2-3 mm bladder calculi are present. 3. Colonic diverticulosis without findings to suggest acute diverticulitis at this time. 4. Atherosclerosis, including left anterior descending coronary artery disease. Please note that  although the presence of coronary artery calcium documents the presence of coronary artery disease, the severity of this disease and any potential stenosis cannot be assessed on this non-gated CT examination. Assessment for potential risk factor modification, dietary therapy or pharmacologic therapy may be warranted, if clinically indicated.   Electronically Signed  By: Vinnie Langton M.D.  On: 07/31/2014 09:17   Impression: 64 year old man who has bilateral subpleural lung nodules and extensive hilar and mediastinal adenopathy. He has scattered adenopathy elsewhere, but the most concentrated area is in the mediastinum. Differential diagnosis includes lymphoma, sarcoidosis, infectious etiologies. Of these lymphoma is the most likely. Another consideration is melanoma since he recently had surgery for that back in July 2015. I think that is less likely.  He needs a biopsy for diagnostic purposes. I do not think EBUS will be particularly helpful in this case with lymphoma being such a strong consideration. I recommended that Mr. Navarez that we do a mediastinoscopy. I discussed the general nature of the procedure with Mr. and Mrs. Wence. We discussed the need for general anesthesia, incision be used, the out patient nature of the procedure and the recovery time. They do understand that this is diagnostic and not therapeutic. I informed them of the indications, risks, benefits, and alternatives. They understand the risk include, but are not limited to death, bleeding, infection, pneumothorax, recurrent nerve injury leading to hoarseness, esophageal injury, and stroke. They understand that these risks are less than 1%.  He accepts the risks and wishes to proceed  Plan: Mediastinoscopy on Wednesday, 08/13/2014

## 2014-08-12 ENCOUNTER — Encounter (HOSPITAL_COMMUNITY): Payer: Self-pay

## 2014-08-12 ENCOUNTER — Encounter: Payer: Self-pay | Admitting: Thoracic Surgery (Cardiothoracic Vascular Surgery)

## 2014-08-12 ENCOUNTER — Encounter (HOSPITAL_COMMUNITY)
Admission: RE | Admit: 2014-08-12 | Discharge: 2014-08-12 | Disposition: A | Discharge status: Home / Self Care | Payer: Medicare Other | Source: Ambulatory Visit | Attending: Thoracic Surgery (Cardiothoracic Vascular Surgery) | Admitting: Thoracic Surgery (Cardiothoracic Vascular Surgery)

## 2014-08-12 ENCOUNTER — Ambulatory Visit (HOSPITAL_COMMUNITY)
Admission: RE | Admit: 2014-08-12 | Discharge: 2014-08-12 | Disposition: A | Discharge status: Home / Self Care | Payer: Medicare Other | Source: Ambulatory Visit | Attending: Thoracic Surgery (Cardiothoracic Vascular Surgery) | Admitting: Thoracic Surgery (Cardiothoracic Vascular Surgery)

## 2014-08-12 VITALS — BP 138/79 | HR 85 | Temp 97.8°F | Resp 18 | Wt 206.2 lb

## 2014-08-12 DIAGNOSIS — Z01811 Encounter for preprocedural respiratory examination: Secondary | ICD-10-CM | POA: Insufficient documentation

## 2014-08-12 DIAGNOSIS — I10 Essential (primary) hypertension: Secondary | ICD-10-CM | POA: Diagnosis not present

## 2014-08-12 DIAGNOSIS — F418 Other specified anxiety disorders: Secondary | ICD-10-CM | POA: Diagnosis not present

## 2014-08-12 DIAGNOSIS — R590 Localized enlarged lymph nodes: Secondary | ICD-10-CM

## 2014-08-12 DIAGNOSIS — Z8546 Personal history of malignant neoplasm of prostate: Secondary | ICD-10-CM | POA: Diagnosis not present

## 2014-08-12 DIAGNOSIS — D360 Benign neoplasm of lymph nodes: Secondary | ICD-10-CM | POA: Diagnosis present

## 2014-08-12 DIAGNOSIS — E781 Pure hyperglyceridemia: Secondary | ICD-10-CM | POA: Diagnosis not present

## 2014-08-12 DIAGNOSIS — Z87442 Personal history of urinary calculi: Secondary | ICD-10-CM | POA: Diagnosis not present

## 2014-08-12 DIAGNOSIS — F329 Major depressive disorder, single episode, unspecified: Secondary | ICD-10-CM | POA: Diagnosis not present

## 2014-08-12 DIAGNOSIS — G4730 Sleep apnea, unspecified: Secondary | ICD-10-CM | POA: Diagnosis not present

## 2014-08-12 DIAGNOSIS — Z6831 Body mass index (BMI) 31.0-31.9, adult: Secondary | ICD-10-CM | POA: Diagnosis not present

## 2014-08-12 DIAGNOSIS — M1990 Unspecified osteoarthritis, unspecified site: Secondary | ICD-10-CM | POA: Diagnosis not present

## 2014-08-12 DIAGNOSIS — Z7982 Long term (current) use of aspirin: Secondary | ICD-10-CM | POA: Diagnosis not present

## 2014-08-12 HISTORY — DX: Other general symptoms and signs: R68.89

## 2014-08-12 HISTORY — DX: Other specified postprocedural states: Z98.890

## 2014-08-12 HISTORY — DX: Adverse effect of unspecified anesthetic, initial encounter: T41.45XA

## 2014-08-12 HISTORY — DX: Nausea with vomiting, unspecified: R11.2

## 2014-08-12 HISTORY — DX: Other complications of anesthesia, initial encounter: T88.59XA

## 2014-08-12 HISTORY — DX: Anxiety disorder, unspecified: F41.9

## 2014-08-12 LAB — TYPE AND SCREEN
ABO/RH(D): A POS
Antibody Screen: NEGATIVE

## 2014-08-12 LAB — COMPREHENSIVE METABOLIC PANEL
ALBUMIN: 3.9 g/dL (ref 3.5–5.2)
ALT: 17 U/L (ref 0–53)
ANION GAP: 6 (ref 5–15)
AST: 22 U/L (ref 0–37)
Alkaline Phosphatase: 65 U/L (ref 39–117)
BILIRUBIN TOTAL: 0.7 mg/dL (ref 0.3–1.2)
BUN: 16 mg/dL (ref 6–23)
CO2: 25 mmol/L (ref 19–32)
Calcium: 9.5 mg/dL (ref 8.4–10.5)
Chloride: 108 mEq/L (ref 96–112)
Creatinine, Ser: 1.19 mg/dL (ref 0.50–1.35)
GFR calc Af Amer: 72 mL/min — ABNORMAL LOW (ref >90)
GFR, EST NON AFRICAN AMERICAN: 62 mL/min — AB (ref >90)
Glucose, Bld: 110 mg/dL — ABNORMAL HIGH (ref 70–99)
POTASSIUM: 3.9 mmol/L (ref 3.5–5.1)
SODIUM: 139 mmol/L (ref 135–145)
TOTAL PROTEIN: 6.6 g/dL (ref 6.0–8.3)

## 2014-08-12 LAB — SURGICAL PCR SCREEN
MRSA, PCR: NEGATIVE
STAPHYLOCOCCUS AUREUS: NEGATIVE

## 2014-08-12 LAB — CBC
HCT: 44.2 % (ref 39.0–52.0)
Hemoglobin: 15.4 g/dL (ref 13.0–17.0)
MCH: 30.9 pg (ref 26.0–34.0)
MCHC: 34.8 g/dL (ref 30.0–36.0)
MCV: 88.8 fL (ref 78.0–100.0)
PLATELETS: 193 10*3/uL (ref 150–400)
RBC: 4.98 MIL/uL (ref 4.22–5.81)
RDW: 12.8 % (ref 11.5–15.5)
WBC: 7.7 10*3/uL (ref 4.0–10.5)

## 2014-08-12 LAB — ABO/RH: ABO/RH(D): A POS

## 2014-08-12 LAB — PROTIME-INR
INR: 0.99 (ref 0.00–1.49)
Prothrombin Time: 13.2 seconds (ref 11.6–15.2)

## 2014-08-12 LAB — APTT: aPTT: 34 seconds (ref 24–37)

## 2014-08-12 MED ORDER — DEXTROSE 5 % IV SOLN
1.5000 g | INTRAVENOUS | Status: AC
  Administered 2014-08-13: 1.5 g via INTRAVENOUS
  Filled 2014-08-12: qty 1.5

## 2014-08-12 MED ORDER — CHLORHEXIDINE GLUCONATE CLOTH 2 % EX PADS: 6.0000 | MEDICATED_PAD | Freq: Once | CUTANEOUS | Status: DC

## 2014-08-12 NOTE — Progress Notes (Signed)
Patient ID: [name], male   DOB: 1950/12/05, 64 y.o.   MRN: [id number]   [name] supplied me with information from his Mohs surgery in July 2015.  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   We will proceed with mediastinoscopy as planned.

## 2014-08-12 NOTE — Progress Notes (Signed)
Pt. Followed by Dr. Everlene Farrier for PCP, also has seen [name] for back, neck issues since radiation. Pt. Had some cardiac surveillance in the past - (1990's), but was told that he didn't have any abnormal findings, no need for f/u in cardiology dept.- this was in [place]

## 2014-08-12 NOTE — Pre-Procedure Instructions (Signed)
[name]  08/12/2014   Your procedure is scheduled on:  Wednesday, January 20  Report to [facility] Admitting at 1000 AM.  Call this number if you have problems the morning of surgery: [phone]   Remember:   Do not eat food or drink liquids after midnight.tonight.    Take these medicines the morning of surgery with A SIP OF WATER:             amlodipine(Norvasc),venlafaxine(Effexor),tamsulosin(Flomax)   Do not wear jewelry.  Do not wear lotions, powders, or perfumes. You may not wear deodorant.  Do not shave 48 hours prior to surgery. Men may shave face and neck.  Do not bring valuables to the hospital.  [facility] is not responsible for any belongings or valuables.               Contacts, dentures or bridgework may not be worn into surgery.  Leave suitcase in the car. After surgery it may be brought to your room.  For patients admitted to the hospital, discharge time is determined by your   treatment team.               Patients discharged the day of surgery will not be allowed to drive  home.  Name and phone number of your driver:     Special Instructions: [place] - Preparing for Surgery  Before surgery, you can play an important role.  Because skin is not sterile, your skin needs to be as free of germs as possible.  You can reduce the number of germs on you skin by washing with CHG (chlorahexidine gluconate) soap before surgery.  CHG is an antiseptic cleaner which kills germs and bonds with the skin to continue killing germs even after washing.  Please DO NOT use if you have an allergy to CHG or antibacterial soaps.  If your skin becomes reddened/irritated stop using the CHG and inform your nurse when you arrive at Short Stay.  Do not shave (including legs and underarms) for at least 48 hours prior to the first CHG shower.  You may shave your face.  Please follow these instructions carefully:   1.  Shower with CHG Soap the night before surgery and  the   morning of Surgery.  2.  If you choose to wash your hair, wash your hair first as usual with your   normal shampoo.  3.  After you shampoo, rinse your hair and body thoroughly to remove the  Shampoo.  4.  Use CHG as you would any other liquid soap.  You can apply chg directly   to the skin and wash gently with scrungie or a clean washcloth.  5.  Apply the CHG Soap to your body ONLY FROM THE NECK DOWN.   Do not use on open wounds or open sores.  Avoid contact with your eyes,  ears, mouth and genitals (private parts).  Wash genitals (private parts)   with your normal soap.  6.  Wash thoroughly, paying special attention to the area where your surgery  will be performed.  7.  Thoroughly rinse your body with warm water from the neck down.  8.  DO NOT shower/wash with your normal soap after using and rinsing off  the CHG Soap.  9.  Pat yourself dry with a clean towel.            10.  Wear clean pajamas.  11.  Place clean sheets on your bed the night of your first shower and do not  sleep with pets.  Day of Surgery  Do not apply any lotions/deoderants the morning of surgery.  Please wear clean clothes to the hospital/surgery center.     Please read over the following fact sheets that you were given: Pain Booklet, Coughing and Deep Breathing, Blood Transfusion Information and Surgical Site Infection Prevention

## 2014-08-12 NOTE — Progress Notes (Signed)
Page to Rx tech to complete med. list

## 2014-08-13 ENCOUNTER — Encounter (HOSPITAL_COMMUNITY): Payer: Self-pay | Admitting: *Deleted

## 2014-08-13 ENCOUNTER — Ambulatory Visit (HOSPITAL_COMMUNITY): Payer: Medicare Other | Admitting: Anesthesiology

## 2014-08-13 ENCOUNTER — Telehealth: Payer: Self-pay

## 2014-08-13 ENCOUNTER — Ambulatory Visit (HOSPITAL_COMMUNITY)
Admission: RE | Admit: 2014-08-13 | Discharge: 2014-08-13 | Disposition: A | Discharge status: Home / Self Care | Payer: Medicare Other | Source: Ambulatory Visit | Attending: Thoracic Surgery (Cardiothoracic Vascular Surgery) | Admitting: Thoracic Surgery (Cardiothoracic Vascular Surgery)

## 2014-08-13 ENCOUNTER — Encounter (HOSPITAL_COMMUNITY)
Admission: RE | Disposition: A | Discharge status: Home / Self Care | Payer: Self-pay | Source: Ambulatory Visit | Attending: Thoracic Surgery (Cardiothoracic Vascular Surgery)

## 2014-08-13 DIAGNOSIS — I10 Essential (primary) hypertension: Secondary | ICD-10-CM | POA: Diagnosis not present

## 2014-08-13 DIAGNOSIS — D360 Benign neoplasm of lymph nodes: Secondary | ICD-10-CM | POA: Diagnosis not present

## 2014-08-13 DIAGNOSIS — F418 Other specified anxiety disorders: Secondary | ICD-10-CM | POA: Insufficient documentation

## 2014-08-13 DIAGNOSIS — Z87442 Personal history of urinary calculi: Secondary | ICD-10-CM | POA: Insufficient documentation

## 2014-08-13 DIAGNOSIS — Z7982 Long term (current) use of aspirin: Secondary | ICD-10-CM | POA: Insufficient documentation

## 2014-08-13 DIAGNOSIS — Z6831 Body mass index (BMI) 31.0-31.9, adult: Secondary | ICD-10-CM | POA: Insufficient documentation

## 2014-08-13 DIAGNOSIS — G4730 Sleep apnea, unspecified: Secondary | ICD-10-CM | POA: Diagnosis not present

## 2014-08-13 DIAGNOSIS — E781 Pure hyperglyceridemia: Secondary | ICD-10-CM

## 2014-08-13 DIAGNOSIS — M1990 Unspecified osteoarthritis, unspecified site: Secondary | ICD-10-CM | POA: Insufficient documentation

## 2014-08-13 DIAGNOSIS — Z8546 Personal history of malignant neoplasm of prostate: Secondary | ICD-10-CM | POA: Insufficient documentation

## 2014-08-13 DIAGNOSIS — R590 Localized enlarged lymph nodes: Secondary | ICD-10-CM

## 2014-08-13 DIAGNOSIS — J984 Other disorders of lung: Secondary | ICD-10-CM | POA: Diagnosis not present

## 2014-08-13 DIAGNOSIS — F329 Major depressive disorder, single episode, unspecified: Secondary | ICD-10-CM | POA: Insufficient documentation

## 2014-08-13 HISTORY — PX: MEDIASTINOSCOPY: SHX5086

## 2014-08-13 SURGERY — MEDIASTINOSCOPY
Anesthesia: General

## 2014-08-13 MED ORDER — SODIUM CHLORIDE 0.9 % IJ SOLN: 3.0000 mL | INTRAMUSCULAR | Status: DC | PRN

## 2014-08-13 MED ORDER — LISINOPRIL 20 MG PO TABS: 10.0000 mg | ORAL_TABLET | Freq: Every day | ORAL | Status: DC

## 2014-08-13 MED ORDER — SCOPOLAMINE 1 MG/3DAYS TD PT72: 1.0000 | MEDICATED_PATCH | TRANSDERMAL | Status: DC

## 2014-08-13 MED ORDER — HEMOSTATIC AGENTS (NO CHARGE) OPTIME
TOPICAL | Status: DC | PRN
  Administered 2014-08-13: 1 via TOPICAL

## 2014-08-13 MED ORDER — SODIUM CHLORIDE 0.9 % IV SOLN: 250.0000 mL | INTRAVENOUS | Status: DC | PRN

## 2014-08-13 MED ORDER — OXYCODONE HCL 5 MG/5ML PO SOLN: 5.0000 mg | Freq: Once | ORAL | Status: DC | PRN

## 2014-08-13 MED ORDER — FENTANYL CITRATE 0.05 MG/ML IJ SOLN
INTRAMUSCULAR | Status: DC | PRN
Start: 2014-08-13 — End: 2014-08-13
  Administered 2014-08-13: 100 ug via INTRAVENOUS
  Administered 2014-08-13 (×3): 50 ug via INTRAVENOUS

## 2014-08-13 MED ORDER — ACETAMINOPHEN 650 MG RE SUPP
650.0000 mg | RECTAL | Status: DC | PRN
  Filled 2014-08-13: qty 1

## 2014-08-13 MED ORDER — PROPOFOL 10 MG/ML IV BOLUS
INTRAVENOUS | Status: AC
  Filled 2014-08-13: qty 20

## 2014-08-13 MED ORDER — ACETAMINOPHEN 325 MG PO TABS
ORAL_TABLET | ORAL | Status: AC
  Administered 2014-08-13: 325 mg via ORAL
  Filled 2014-08-13: qty 1

## 2014-08-13 MED ORDER — OXYCODONE HCL 5 MG PO TABS: 5.0000 mg | ORAL_TABLET | Freq: Four times a day (QID) | ORAL | Status: DC | PRN

## 2014-08-13 MED ORDER — ACETAMINOPHEN 325 MG PO TABS
650.0000 mg | ORAL_TABLET | ORAL | Status: DC | PRN
  Administered 2014-08-13: 325 mg via ORAL
  Filled 2014-08-13: qty 2

## 2014-08-13 MED ORDER — GLYCOPYRROLATE 0.2 MG/ML IJ SOLN
INTRAMUSCULAR | Status: AC
  Filled 2014-08-13: qty 3

## 2014-08-13 MED ORDER — NIACIN ER (ANTIHYPERLIPIDEMIC) 500 MG PO TBCR: 500.0000 mg | EXTENDED_RELEASE_TABLET | Freq: Two times a day (BID) | ORAL | Status: DC

## 2014-08-13 MED ORDER — NEOSTIGMINE METHYLSULFATE 10 MG/10ML IV SOLN
INTRAVENOUS | Status: AC
  Filled 2014-08-13: qty 2

## 2014-08-13 MED ORDER — OXYCODONE HCL 5 MG PO TABS
ORAL_TABLET | ORAL | Status: AC
  Filled 2014-08-13: qty 2

## 2014-08-13 MED ORDER — DEXAMETHASONE SODIUM PHOSPHATE 4 MG/ML IJ SOLN
INTRAMUSCULAR | Status: DC | PRN
  Administered 2014-08-13: 8 mg via INTRAVENOUS

## 2014-08-13 MED ORDER — VECURONIUM BROMIDE 10 MG IV SOLR
INTRAVENOUS | Status: DC | PRN
  Administered 2014-08-13: 1 mg via INTRAVENOUS

## 2014-08-13 MED ORDER — SODIUM CHLORIDE 0.9 % IJ SOLN: 3.0000 mL | Freq: Two times a day (BID) | INTRAMUSCULAR | Status: DC

## 2014-08-13 MED ORDER — LIDOCAINE HCL (CARDIAC) 20 MG/ML IV SOLN
INTRAVENOUS | Status: DC | PRN
  Administered 2014-08-13: 80 mg via INTRAVENOUS

## 2014-08-13 MED ORDER — ROCURONIUM BROMIDE 100 MG/10ML IV SOLN
INTRAVENOUS | Status: DC | PRN
  Administered 2014-08-13: 40 mg via INTRAVENOUS
  Administered 2014-08-13: 10 mg via INTRAVENOUS

## 2014-08-13 MED ORDER — LACTATED RINGERS IV SOLN
INTRAVENOUS | Status: DC
  Administered 2014-08-13: 11:00:00 via INTRAVENOUS

## 2014-08-13 MED ORDER — ONDANSETRON HCL 4 MG/2ML IJ SOLN
INTRAMUSCULAR | Status: DC | PRN
  Administered 2014-08-13: 4 mg via INTRAVENOUS

## 2014-08-13 MED ORDER — PROPOFOL 10 MG/ML IV BOLUS
INTRAVENOUS | Status: DC | PRN
  Administered 2014-08-13: 120 mg via INTRAVENOUS

## 2014-08-13 MED ORDER — PROMETHAZINE HCL 25 MG/ML IJ SOLN: 6.2500 mg | INTRAMUSCULAR | Status: DC | PRN

## 2014-08-13 MED ORDER — SCOPOLAMINE 1 MG/3DAYS TD PT72
MEDICATED_PATCH | TRANSDERMAL | Status: AC
  Administered 2014-08-13: 1.5 mg
  Filled 2014-08-13: qty 1

## 2014-08-13 MED ORDER — GLYCOPYRROLATE 0.2 MG/ML IJ SOLN
INTRAMUSCULAR | Status: DC | PRN
  Administered 2014-08-13: 0.6 mg via INTRAVENOUS

## 2014-08-13 MED ORDER — MIDAZOLAM HCL 5 MG/5ML IJ SOLN
INTRAMUSCULAR | Status: DC | PRN
  Administered 2014-08-13: 2 mg via INTRAVENOUS

## 2014-08-13 MED ORDER — LACTATED RINGERS IV SOLN
INTRAVENOUS | Status: DC | PRN
  Administered 2014-08-13 (×2): via INTRAVENOUS

## 2014-08-13 MED ORDER — OXYCODONE HCL 5 MG PO TABS
5.0000 mg | ORAL_TABLET | Freq: Four times a day (QID) | ORAL | Status: DC | PRN
  Administered 2014-08-13: 10 mg via ORAL
  Filled 2014-08-13: qty 2

## 2014-08-13 MED ORDER — EPHEDRINE SULFATE 50 MG/ML IJ SOLN
INTRAMUSCULAR | Status: DC | PRN
  Administered 2014-08-13: 10 mg via INTRAVENOUS
  Administered 2014-08-13: 5 mg via INTRAVENOUS
  Administered 2014-08-13 (×2): 10 mg via INTRAVENOUS
  Administered 2014-08-13: 5 mg via INTRAVENOUS

## 2014-08-13 MED ORDER — SCOPOLAMINE 1 MG/3DAYS TD PT72
MEDICATED_PATCH | TRANSDERMAL | Status: AC
  Filled 2014-08-13: qty 1

## 2014-08-13 MED ORDER — PHENYLEPHRINE HCL 10 MG/ML IJ SOLN
INTRAMUSCULAR | Status: DC | PRN
  Administered 2014-08-13 (×8): 80 ug via INTRAVENOUS

## 2014-08-13 MED ORDER — 0.9 % SODIUM CHLORIDE (POUR BTL) OPTIME
TOPICAL | Status: DC | PRN
  Administered 2014-08-13: 1000 mL

## 2014-08-13 MED ORDER — NEOSTIGMINE METHYLSULFATE 10 MG/10ML IV SOLN
INTRAVENOUS | Status: DC | PRN
  Administered 2014-08-13: 4 mg via INTRAVENOUS

## 2014-08-13 MED ORDER — OXYCODONE HCL 5 MG PO TABS: 5.0000 mg | ORAL_TABLET | Freq: Once | ORAL | Status: DC | PRN

## 2014-08-13 MED ORDER — HYDROMORPHONE HCL 1 MG/ML IJ SOLN
INTRAMUSCULAR | Status: AC
  Administered 2014-08-13: 0.5 mg via INTRAVENOUS
  Filled 2014-08-13: qty 1

## 2014-08-13 MED ORDER — MIDAZOLAM HCL 2 MG/2ML IJ SOLN
INTRAMUSCULAR | Status: AC
  Filled 2014-08-13: qty 2

## 2014-08-13 MED ORDER — HYDROMORPHONE HCL 1 MG/ML IJ SOLN
0.2500 mg | INTRAMUSCULAR | Status: DC | PRN
  Administered 2014-08-13 (×2): 0.5 mg via INTRAVENOUS

## 2014-08-13 MED ORDER — FENTANYL CITRATE 0.05 MG/ML IJ SOLN
INTRAMUSCULAR | Status: AC
  Filled 2014-08-13: qty 5

## 2014-08-13 SURGICAL SUPPLY — 49 items
ADH SKN CLS APL DERMABOND .7 (GAUZE/BANDAGES/DRESSINGS) ×1
APPLIER CLIP LOGIC TI 5 (MISCELLANEOUS) IMPLANT
APR CLP MED LRG 33X5 (MISCELLANEOUS)
BLADE SURG 15 STRL LF DISP TIS (BLADE) IMPLANT
BLADE SURG 15 STRL SS (BLADE) ×2
CANISTER SUCTION 2500CC (MISCELLANEOUS) ×2 IMPLANT
CLIP TI MEDIUM 6 (CLIP) IMPLANT
CONT SPEC 4OZ CLIKSEAL STRL BL (MISCELLANEOUS) ×7 IMPLANT
COVER SURGICAL LIGHT HANDLE (MISCELLANEOUS) ×3 IMPLANT
DERMABOND ADVANCED (GAUZE/BANDAGES/DRESSINGS) ×1
DERMABOND ADVANCED .7 DNX12 (GAUZE/BANDAGES/DRESSINGS) ×1 IMPLANT
DRAPE CHEST BREAST 15X10 FENES (DRAPES) ×2 IMPLANT
ELECT REM PT RETURN 9FT ADLT (ELECTROSURGICAL) ×2
ELECTRODE REM PT RTRN 9FT ADLT (ELECTROSURGICAL) ×1 IMPLANT
GAUZE SPONGE 4X4 12PLY STRL (GAUZE/BANDAGES/DRESSINGS) ×2 IMPLANT
GAUZE SPONGE 4X4 16PLY XRAY LF (GAUZE/BANDAGES/DRESSINGS) ×2 IMPLANT
GLOVE BIOGEL PI IND STRL 6 (GLOVE) IMPLANT
GLOVE BIOGEL PI IND STRL 7.0 (GLOVE) IMPLANT
GLOVE BIOGEL PI INDICATOR 6 (GLOVE) ×1
GLOVE BIOGEL PI INDICATOR 7.0 (GLOVE) ×3
GLOVE SURG SIGNA 7.5 PF LTX (GLOVE) ×3 IMPLANT
GOWN STRL REUS W/ TWL LRG LVL3 (GOWN DISPOSABLE) ×1 IMPLANT
GOWN STRL REUS W/ TWL XL LVL3 (GOWN DISPOSABLE) ×1 IMPLANT
GOWN STRL REUS W/TWL LRG LVL3 (GOWN DISPOSABLE) ×2
GOWN STRL REUS W/TWL XL LVL3 (GOWN DISPOSABLE) ×2
HEMOSTAT SURGICEL 2X14 (HEMOSTASIS) ×1 IMPLANT
KIT BASIN OR (CUSTOM PROCEDURE TRAY) ×2 IMPLANT
KIT ROOM TURNOVER OR (KITS) ×2 IMPLANT
NS IRRIG 1000ML POUR BTL (IV SOLUTION) ×2 IMPLANT
PACK SURGICAL SETUP 50X90 (CUSTOM PROCEDURE TRAY) ×2 IMPLANT
PAD ARMBOARD 7.5X6 YLW CONV (MISCELLANEOUS) ×4 IMPLANT
PENCIL BUTTON HOLSTER BLD 10FT (ELECTRODE) ×2 IMPLANT
SPONGE INTESTINAL PEANUT (DISPOSABLE) IMPLANT
SUT SILK 2 0 TIES 10X30 (SUTURE) IMPLANT
SUT SILK 3 0 (SUTURE) ×2
SUT SILK 3-0 18XBRD TIE 12 (SUTURE) IMPLANT
SUT VIC AB 2-0 CT1 27 (SUTURE)
SUT VIC AB 2-0 CT1 TAPERPNT 27 (SUTURE) IMPLANT
SUT VIC AB 3-0 SH 18 (SUTURE) IMPLANT
SUT VIC AB 3-0 SH 27 (SUTURE) ×2
SUT VIC AB 3-0 SH 27X BRD (SUTURE) ×1 IMPLANT
SUT VICRYL 4-0 PS2 18IN ABS (SUTURE) ×2 IMPLANT
SWAB COLLECTION DEVICE MRSA (MISCELLANEOUS) IMPLANT
SYRINGE 10CC LL (SYRINGE) ×2 IMPLANT
TOWEL OR 17X24 6PK STRL BLUE (TOWEL DISPOSABLE) ×2 IMPLANT
TOWEL OR 17X26 10 PK STRL BLUE (TOWEL DISPOSABLE) ×2 IMPLANT
TUBE ANAEROBIC SPECIMEN COL (MISCELLANEOUS) IMPLANT
TUBE CONNECTING 12X1/4 (SUCTIONS) ×2 IMPLANT
WATER STERILE IRR 1000ML POUR (IV SOLUTION) ×2 IMPLANT

## 2014-08-13 NOTE — Op Note (Signed)
[name], [name] NO.:  [id number]  MEDICAL RECORD NO.:  [id number]  LOCATION:  [facility]                         FACILITY:  [facility]  PHYSICIAN:  Revonda Standard. Roxan Hockey, M.D.DATE OF BIRTH:  Dec 28, 1950  DATE OF PROCEDURE:  08/13/2014 DATE OF DISCHARGE:  08/13/2014                              OPERATIVE REPORT   PREOPERATIVE DIAGNOSIS:  Mediastinal adenopathy.  POSTOPERATIVE DIAGNOSIS:  Granulomatous disease.  PROCEDURE:  Mediastinoscopy.  SURGEON:  Revonda Standard. Roxan Hockey, M.D.  ASSISTANT:  None.  ANESTHESIA:  General.  FINDINGS:  Enlarged, granular lymph nodes.  Frozen section showed granulomatous inflammation.  CLINICAL NOTE:  Mr. Hair is a 64 year old gentleman who recently had kidney stones. On a CT of the abdomen and pelvis he was noted to have a 10 mm nodule in the right middle lobe.  A CT of the chest was done which showed bilateral lung nodules and hilar and mediastinal adenopathy.  He saw Dr. Leonides Schanz and Dr. Marin Olp.  A PET-CT showed the hilar and mediastinal lymph nodes were hypermetabolic.  The patient was referred for mediastinoscopy for diagnostic purposes.  The indications, risks, benefits, and alternatives were discussed in detail with the patient. He understood and accepted the risks of surgery and agreed to proceed.  OPERATIVE NOTE:  [name] was brought to the operating room on August 13, 2014.  He had induction of general anesthesia and was intubated.  The neck and chest were prepped and draped in the usual sterile fashion.  A transverse incision was made 1 fingerbreadth above the sternal notch.  It was carried through the skin and subcutaneous tissue. Hemostasis was achieved with electrocautery.  The strap muscles were separated in the midline.  The pretracheal fascia was identified and incised and the pretracheal plane was developed into the mediastinum bluntly.  A mediastinoscope was inserted and systematic inspection  of mediastinal lymph node stations was carried out.  In the right paratracheal 4R station, there was an enlarged node that was purplish in color and slightly granular in texture.  Biopsies from this node were taken and sent for pathology.  Right behind this node, there was a larger node. This was more yellowish in coloration, and it was much harder and coarser in texture than the first node.  Biopsies were taken of this node and sent for frozen section as well as permanent section.  Additional portions of the nodes were sent for AFB and fungal cultures as well.  A third node then was noted just beyond those 2 nodes, this was more in character with the first node.  This was biopsied and sent for permanent pathology. Finally, while awaiting the results of frozen section, the subcarinal space was explored and a node there, which was also very firm and consistency with granular texture, was biopsied as well.  This majority of this node was also sent for permanent Pathology. A small portion of the specimen was added to the AFB and fungal culture specimen as well.  The wound was packed with gauze.  The frozen section on the 4R #2 node returned showing granulomatous inflammation.  The packing was removed.  The scope was reinserted, there was good hemostasis.  The scope was withdrawn.  The incision was closed with a 3-0 Vicryl subcutaneous suture followed by a 4-0 Vicryl subcuticular suture.  The patient was extubated in the operating room and taken to the postanesthetic care unit in good condition.  All sponge, needle, and instrument counts were correct.     Revonda Standard Roxan Hockey, M.D.     SCH/MEDQ  D:  08/13/2014  T:  08/13/2014  Job:  [id number]

## 2014-08-13 NOTE — Anesthesia Preprocedure Evaluation (Addendum)
Anesthesia Evaluation  Patient identified by MRN, date of birth, ID band Patient awake    Reviewed: Allergy & Precautions, NPO status , Patient's Chart, lab work & pertinent test results  History of Anesthesia Complications (+) PONV  Airway Mallampati: III  TM Distance: >3 FB Neck ROM: Full    Dental  (+) Teeth Intact, Dental Advisory Given   Pulmonary sleep apnea and Continuous Positive Airway Pressure Ventilation ,  breath sounds clear to auscultation        Cardiovascular hypertension, Pt. on medications Rhythm:Regular Rate:Normal     Neuro/Psych Anxiety Depression negative neurological ROS     GI/Hepatic negative GI ROS, Neg liver ROS,   Endo/Other  Morbid obesity  Renal/GU negative Renal ROS     Musculoskeletal  (+) Arthritis -,   Abdominal   Peds  Hematology negative hematology ROS (+)   Anesthesia Other Findings   Reproductive/Obstetrics                            Anesthesia Physical Anesthesia Plan  ASA: III  Anesthesia Plan: General   Post-op Pain Management:    Induction: Intravenous  Airway Management Planned: Oral ETT  Additional Equipment:   Intra-op Plan:   Post-operative Plan: Extubation in OR  Informed Consent: I have reviewed the patients History and Physical, chart, labs and discussed the procedure including the risks, benefits and alternatives for the proposed anesthesia with the patient or authorized representative who has indicated his/her understanding and acceptance.   Dental advisory given  Plan Discussed with: CRNA  Anesthesia Plan Comments:         Anesthesia Quick Evaluation

## 2014-08-13 NOTE — Anesthesia Postprocedure Evaluation (Signed)
  Anesthesia Post-op Note  Patient: [name]  Procedure(s) Performed: Procedure(s): MEDIASTINOSCOPY (N/A)  Patient Location: PACU  Anesthesia Type:General  Level of Consciousness: awake and alert   Airway and Oxygen Therapy: Patient Spontanous Breathing  Post-op Pain: none  Post-op Assessment: Post-op Vital signs reviewed  Post-op Vital Signs: Reviewed  Last Vitals:  Filed Vitals:   08/13/14 1515  BP: 112/56  Pulse: 86  Temp:   Resp: 17    Complications: No apparent anesthesia complications

## 2014-08-13 NOTE — Transfer of Care (Signed)
Immediate Anesthesia Transfer of Care Note  Patient: [name]  Procedure(s) Performed: Procedure(s): MEDIASTINOSCOPY (N/A)  Patient Location: PACU  Anesthesia Type:General  Level of Consciousness: awake, alert , oriented and patient cooperative  Airway & Oxygen Therapy: Patient Spontanous Breathing and Patient connected to nasal cannula oxygen  Post-op Assessment: Report given to PACU RN and Post -op Vital signs reviewed and stable  Post vital signs: Reviewed and stable  Complications: No apparent anesthesia complications

## 2014-08-13 NOTE — Interval H&P Note (Signed)
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 After consideration of risks, benefits and other options for treatment, the patient has consented to  Procedure(s): MEDIASTINOSCOPY (N/A) as a surgical intervention .  The patient's history has been reviewed, patient examined, no change in status, stable for surgery.  I have reviewed the patient's chart and labs.  Questions were answered to the patient's satisfaction.     [name] C

## 2014-08-13 NOTE — Brief Op Note (Signed)
08/13/2014  1:29 PM  PATIENT:  Antony Madura  64 y.o. male  PRE-OPERATIVE DIAGNOSIS:  MEDIASTINAL ADENOPATHY  POST-OPERATIVE DIAGNOSIS:  MEDIASTINAL ADENOPATHY  PROCEDURE:  Procedure(s): MEDIASTINOSCOPY (N/A)  SURGEON:  Surgeon(s) and Role:    * [name], MD - Primary  PHYSICIAN ASSISTANT:   ASSISTANTS: none   ANESTHESIA:   general  EBL:  Total I/O In: 1000 [I.V.:1000] Out: -   BLOOD ADMINISTERED:none  DRAINS: none   LOCAL MEDICATIONS USED:  NONE  SPECIMEN:  Source of Specimen:  Mediastinal lymph nodes  DISPOSITION OF SPECIMEN:  Path and micro(AFB/ fungal)  COUNTS:  YES  PLAN OF CARE: Discharge to home after PACU  PATIENT DISPOSITION:  PACU - hemodynamically stable.   Delay start of Pharmacological VTE agent (>24hrs) due to surgical blood loss or risk of bleeding: not applicable  FROZEN- Granulomatous disease

## 2014-08-13 NOTE — H&P (View-Only) (Signed)
PCP is DAUB, Lina Sayre, MD Referring Provider is Volanda Napoleon, MD  Chief Complaint  Patient presents with  . Adenopathy    mediastinal....CT CHEST/PET    HPI: 64 year old man sent for consultation regarding mediastinal adenopathy.  [name] is a 64 year old gentleman who is a lifelong nonsmoker. 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 He was in his usual state of health until January 3. He experienced severe back pain which led to him going to the emergency room. Apparently at one point even had a seizure. He turned out to have kidney stones. A CT of the abdomen and pelvis was done. It demonstrated the kidney stone in the left ureter. It also showed a 10 mm nodule in the right middle lobe. A CT of the chest was done which showed bilateral lung nodules and also hilar and mediastinal adenopathy. He saw both [name] and Dr. Marin Olp in addition to Dr. Everlene Farrier during this time. A PET CT showed markedly hypermetabolic hilar and mediastinal adenopathy. There were several other scattered lymph nodes that were hypermetabolic including inguinal nodes, supraclavicular node and a retroperitoneal node.   He had back and chest discomfort with his kidney stone. He denies fevers, chills, or night sweats. He has no known exposure to tuberculosis. He has not traveled out of the country or to the [facility]. He doesn't have any recent exposure to birds. He has lost about 6 pounds over the past 3 months and 11 pounds over the past 6 months due to diet and exercise. His appetite remains good. He remains very physically active.  Zubrod Score: At the time of surgery this patient's most appropriate activity status/level should be described as: [x]     0    Normal activity, no symptoms []     1    Restricted in physical strenuous activity but ambulatory, able to do out light work []     2    Ambulatory and capable of  self care, unable to do work activities, up and about >50 % of waking hours                              []     3    Only limited self care, in bed greater than 50% of waking hours []     4    Completely disabled, no self care, confined to bed or chair []     5    Moribund   Past Medical History  Diagnosis Date  . Pure hyperglyceridemia   . Colon polyps   . Sleep apnea     wears CPAP  . Hypertension   . Kidney stones   . Allergy   . Arthritis   . Cataract   . Depression     no per pt  . Neuromuscular disorder     pinched nerve right arm  . Prostate cancer     Past Surgical History  Procedure Laterality Date  . Retinal tear repair cryotherapy      right  . Knee surgery      both knees done  . Kidney stone surgery    . Tonsillectomy and adenoidectomy    . Cataract extraction      left  . Colonoscopy    . Eye surgery      Family History  Problem Relation Age  of Onset  . Clotting disorder Mother   . Colon polyps Father   . Diabetes Father   . Colon cancer Neg Hx   . Esophageal cancer Neg Hx   . Stomach cancer Neg Hx   . Rectal cancer Neg Hx     Social History History  Substance Use Topics  . Smoking status: Never Smoker   . Smokeless tobacco: Never Used  . Alcohol Use: Yes     Comment: social    Current Outpatient Prescriptions  Medication Sig Dispense Refill  . amLODipine (NORVASC) 10 MG tablet TAKE 1 TABLET DAILY (Patient taking differently: Take 5 mg by mouth daily. Takes 1/2 tab daily) 90 tablet 3  . aspirin EC 81 MG tablet Take 81 mg by mouth daily.    . cholecalciferol (VITAMIN D) 1000 UNITS tablet Take 1,000 Units by mouth daily.    . Cyanocobalamin (VITAMIN B-12 PO) Take 1 tablet by mouth daily.    . fluticasone (FLONASE) 50 MCG/ACT nasal spray Place 1 spray into both nostrils daily as needed (congestion).    Marland Kitchen lisinopril (PRINIVIL,ZESTRIL) 20 MG tablet Take 1 tablet (20 mg total) by mouth 2 (two) times daily. (Patient taking differently: Take 10 mg  by mouth daily. ) 180 tablet 3  . niacin (NIASPAN) 500 MG CR tablet Take 1 tablet (500 mg total) by mouth at bedtime. 90 tablet 3  . tamsulosin (FLOMAX) 0.4 MG CAPS capsule Take 0.4 mg by mouth 2 (two) times daily.  5  . venlafaxine (EFFEXOR) 75 MG tablet Take 1 tablet (75 mg total) by mouth 2 (two) times daily. 180 tablet 3  . Vitamin D, Ergocalciferol, (DRISDOL) 50000 UNITS CAPS Take 1 capsule (50,000 Units total) by mouth every 7 (seven) days. 30 capsule 1   No current facility-administered medications for this visit.    No Known Allergies  Review of Systems  Constitutional: Negative for fever, chills, activity change, fatigue and unexpected weight change (has lost 6 pounds last 3 months- diet).  Respiratory: Positive for apnea (uses CPAP at night).   Cardiovascular: Positive for chest pain.  Gastrointestinal: Positive for constipation.  Genitourinary:       Hx prostate cancer, kidney stones  Musculoskeletal: Positive for arthralgias.  Neurological: Positive for seizures (July 27, 2014, at time of kidney stone).  Hematological: Positive for adenopathy. Does not bruise/bleed easily.  All other systems reviewed and are negative.   BP 125/86 mmHg  Pulse 64  Resp 16  Ht 5\' 8"  (1.727 m)  Wt 204 lb (92.534 kg)  BMI 31.03 kg/m2  SpO2 98% Physical Exam  Constitutional: He is oriented to person, place, and time. He appears well-developed and well-nourished. No distress.  HENT:  Head: Normocephalic and atraumatic.  Well healed Moh's site anterior to right ear  Eyes: EOM are normal. Pupils are equal, round, and reactive to light.  Neck: Neck supple. No thyromegaly present.  Cardiovascular: Normal rate, regular rhythm, normal heart sounds and intact distal pulses.  Exam reveals no gallop and no friction rub.   No murmur heard. Pulmonary/Chest: Effort normal and breath sounds normal. He has no wheezes. He has no rales.  Questionable left axillary node  Abdominal: Soft. Bowel sounds  are normal.  Musculoskeletal: He exhibits no edema.  Lymphadenopathy:    He has no cervical adenopathy.  Neurological: He is alert and oriented to person, place, and time. No cranial nerve deficit.  Skin: Skin is warm and dry.  Vitals reviewed.    Diagnostic Tests: CT  CHEST WITHOUT CONTRAST  TECHNIQUE: Multidetector CT imaging of the chest was performed following the standard protocol without IV contrast.  COMPARISON: 07/27/2014 CT abdomen pelvis  FINDINGS: There are numerous mediastinal lymph nodes including an 12 mm aortopulmonary window lymph node, 15 mm precarinal lymph node, 12 mm paratracheal lymph node, and 17 mm subcarinal lymph node. The bilateral hila are not evaluated well without contrast.  There is left anterior descending artery calcification. Heart size is normal. No pleural or pericardial effusion.  Thoracic inlet is normal. Thyroid is normal.  10 mm right middle lobe with irregular borders as described on CT performed earlier today. No additional nodules on the right. Left lung demonstrates a 10 mm nodule posteriorly in the inferior aspect of the left upper lobe. There is mild bilateral lower lobe atelectasis.  Thoracic skeletal structures intact.  IMPRESSION: 1 cm right middle lobe and 1 cm left upper lobe pulmonary nodules concerning for malignancy. These could be further evaluated with PET scan.   Electronically Signed  By: Skipper Cliche M.D.  On: 07/27/2014 20:55  NUCLEAR MEDICINE PET SKULL BASE TO THIGH  TECHNIQUE: 10.6 mCi F-18 FDG was injected intravenously. Full-ring PET imaging was performed from the skull base to thigh after the radiotracer. CT data was obtained and used for attenuation correction and anatomic localization.  FASTING BLOOD GLUCOSE: Value: 98 mg/dl  COMPARISON: Chest CT 07/27/2014.  FINDINGS: NECK  Nonenlarged mildly hypermetabolic right supraclavicular lymph node (SUVmax =  3.7).  CHEST  The previously described 1 cm right upper lobe pulmonary nodule demonstrates only low-level metabolic activity (SUVmax = 1.3). The previously described left upper lobe pulmonary nodule also demonstrates low-level metabolic activity (SUVmax = 2.3). Both of these nodules of but the fissures. There is also a 3 mm subpleural nodule in the periphery of the left upper lobe (image 26 of series 6). Multiple borderline enlarged and mildly enlarged lymph nodes in the mediastinal and hilar stations are diffusely hypermetabolic. Specific examples include a 1.3 cm short axis high right paratracheal lymph node (SUVmax = 12), 1.6 cm short axis lower right paratracheal lymph node (SUVmax = 8.4), and a cluster of lymph nodes in the subcarinal nodal station measuring up to 1.4 cm in short axis (SUVmax = 12.7). Heart size is normal. There is no significant pericardial fluid, thickening or pericardial calcification. There is atherosclerosis of the thoracic aorta, the great vessels of the mediastinum and the coronary arteries, including calcified atherosclerotic plaque in the left anterior descending coronary artery. Faint calcifications of the aortic valve.  ABDOMEN/PELVIS  No abnormal hypermetabolic activity within the liver, pancreas, adrenal glands, or spleen. Multiple borderline enlarged and mildly enlarged upper abdominal lymph nodes, including a 14 mm short axis hepatoduodenal ligament lymph node (SUVmax = 7.2), and a 10 x 41 mm portacaval lymph node (SUVmax = 9.0). Several nonobstructive calculi are present within the collecting systems of the kidneys bilaterally, largest of which measures up to 5 mm in the interpolar collecting system of the left kidney. There are two 2-3 mm calculi present within the urinary bladder. In addition, at the left ureterovesicular junction there is a 5 mm calculus (image 191 of series 4). This is associated with mild proximal  left hydroureteronephrosis and left perinephric stranding, indicative of obstruction at this time. Numerous colonic diverticulae are present, particularly in the sigmoid colon, without surrounding inflammatory changes to suggest an acute diverticulitis at this time. Fiducial markers are noted in the region of the prostate gland. Multiple borderline enlarged and mildly enlarged  bilateral external iliac, inguinal and femoral nodes are noted bilaterally, which demonstrate hypermetabolism. The largest of these measures up to 11 mm in the right superficial femoral nodal station (SUVmax = 8.6).  SKELETON  No focal hypermetabolic activity to suggest skeletal metastasis.  IMPRESSION: 1. Extensive hypermetabolic lymphadenopathy noted in the right supraclavicular region, chest, abdomen and pelvis, as detailed above. Findings are concerning for potential lymphoproliferative disorder. However, given the subpleural nodules in the lungs bilaterally, another differential consideration would include a systemic disease such as sarcoidosis. Clinical correlation is recommended, with consideration for bronchoscopy and nodal sampling of the mediastinal and/or hilar lymph nodes if clinically appropriate. Less likely, the pulmonary nodules may represent primary bronchogenic malignancies. Regardless of the findings at bronchoscopy, repeat noncontrast chest CT 3-6 months is recommended to evaluate the stability of the pulmonary nodules. 2. 5 mm partially obstructive calculus at the left ureterovesicular junction with mild proximal left hydroureteronephrosis. Multiple nonobstructive calculi are present within the collecting systems of the kidneys bilaterally, and 2 additional tiny 2-3 mm bladder calculi are present. 3. Colonic diverticulosis without findings to suggest acute diverticulitis at this time. 4. Atherosclerosis, including left anterior descending coronary artery disease. Please note that  although the presence of coronary artery calcium documents the presence of coronary artery disease, the severity of this disease and any potential stenosis cannot be assessed on this non-gated CT examination. Assessment for potential risk factor modification, dietary therapy or pharmacologic therapy may be warranted, if clinically indicated.   Electronically Signed  By: Vinnie Langton M.D.  On: 07/31/2014 09:17   Impression: 64 year old man who has bilateral subpleural lung nodules and extensive hilar and mediastinal adenopathy. He has scattered adenopathy elsewhere, but the most concentrated area is in the mediastinum. Differential diagnosis includes lymphoma, sarcoidosis, infectious etiologies. Of these lymphoma is the most likely. Another consideration is melanoma since he recently had surgery for that back in July 2015. I think that is less likely.  He needs a biopsy for diagnostic purposes. I do not think EBUS will be particularly helpful in this case with lymphoma being such a strong consideration. I recommended that [name] that we do a mediastinoscopy. I discussed the general nature of the procedure with [name]. We discussed the need for general anesthesia, incision be used, the out patient nature of the procedure and the recovery time. They do understand that this is diagnostic and not therapeutic. I informed them of the indications, risks, benefits, and alternatives. They understand the risk include, but are not limited to death, bleeding, infection, pneumothorax, recurrent nerve injury leading to hoarseness, esophageal injury, and stroke. They understand that these risks are less than 1%.  He accepts the risks and wishes to proceed  Plan: Mediastinoscopy on Wednesday, 08/13/2014

## 2014-08-13 NOTE — Telephone Encounter (Signed)
Pt had biopsy done on his lymph nodes-all indications are looking like noncancerous, possibly a fungal infection. She wanted Dr. Everlene Farrier to know. Please advise at  [phone]

## 2014-08-13 NOTE — Discharge Instructions (Signed)
Do not drive or engage in heavy physical activity for 24 hours.  After 24 hours, you may drive when you are no longer taking the prescription pain medication  You may shower tomorrow  You have a prescription for a pain medication. You may use that as directed  You may use Tylenol or ibuprofen in addition to, or instead of, the prescription pain medication  Call [phone] if you develop severe pain, fever > 101, chest pain or shortness of breath, or notice redness around or drainage from the incision  My office will contact you with follow up information

## 2014-08-14 ENCOUNTER — Encounter (HOSPITAL_COMMUNITY): Payer: Self-pay | Admitting: Thoracic Surgery (Cardiothoracic Vascular Surgery)

## 2014-08-14 ENCOUNTER — Encounter: Payer: Self-pay | Admitting: Hematology & Oncology

## 2014-08-14 NOTE — Telephone Encounter (Signed)
I have reviewed the notes from the surgeon. This is great news

## 2014-08-16 ENCOUNTER — Telehealth: Payer: Self-pay | Admitting: Emergency Medicine

## 2014-08-16 NOTE — Telephone Encounter (Signed)
Feels great. Not sick. Doing well.

## 2014-08-18 LAB — ANAEROBIC CULTURE

## 2014-08-18 LAB — TISSUE CULTURE

## 2014-08-19 ENCOUNTER — Telehealth: Payer: Self-pay

## 2014-08-19 NOTE — Telephone Encounter (Signed)
Dr. Everlene Farrier spoke to Dr. Cyndia Bent regarding patient's lab results.  [name] was not available at the time but Dr. Cyndia Bent will advise him of results.

## 2014-08-21 ENCOUNTER — Telehealth: Payer: Self-pay | Admitting: Emergency Medicine

## 2014-08-21 NOTE — Telephone Encounter (Signed)
I called and spoke with Dr. Cyndia Bent and discussed the culture results with him in Dr. Leonarda Salon absence. The culture results are inconsistent with the pathology report. He will forward this information to [name]. the patient does have a follow-up appointment with [name]

## 2014-08-27 ENCOUNTER — Encounter: Payer: Self-pay | Admitting: Thoracic Surgery (Cardiothoracic Vascular Surgery)

## 2014-08-27 ENCOUNTER — Ambulatory Visit (INDEPENDENT_AMBULATORY_CARE_PROVIDER_SITE_OTHER): Payer: Medicare Other | Admitting: Thoracic Surgery (Cardiothoracic Vascular Surgery)

## 2014-08-27 VITALS — BP 114/79 | HR 72 | Resp 20 | Ht 68.0 in | Wt 205.0 lb

## 2014-08-27 DIAGNOSIS — D71 Functional disorders of polymorphonuclear neutrophils: Secondary | ICD-10-CM

## 2014-08-27 NOTE — Progress Notes (Signed)
HPI:  [name] returns today to discuss the results from his mediastinoscopy.   He feels well. He is not having much pain. He does have some swelling at the incision. He denies fevers, chills and sweats.  Past Medical History  Diagnosis Date  . Pure hyperglyceridemia   . Colon polyps   . Sleep apnea     wears CPAP  . Hypertension   . Kidney stones   . Allergy   . Arthritis   . Cataract   . Depression     no per pt  . Neuromuscular disorder     pinched nerve right arm  . Prostate cancer   . Complication of anesthesia   . PONV (postoperative nausea and vomiting)   . Anxiety   . Neck complaint     also with low back pain, following with Dr. 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 amLODipine (NORVASC) 10 MG tablet Take 5 mg by mouth daily.    Marland Kitchen aspirin EC 81 MG tablet Take 81 mg by mouth daily.    . cholecalciferol (VITAMIN D) 1000 UNITS tablet Take 1,000 Units by mouth daily.    . Cyanocobalamin (VITAMIN B-12 PO) Take 1,000 mcg by mouth daily.     . fluticasone (FLONASE) 50 MCG/ACT nasal spray Place 1 spray into both nostrils daily as needed (congestion).    Marland Kitchen lisinopril (PRINIVIL,ZESTRIL) 20 MG tablet Take 0.5 tablets (10 mg total) by mouth daily. 180 tablet 3  . niacin (NIASPAN) 500 MG CR tablet Take 1 tablet (500 mg total) by mouth 2 (two) times daily. 90 tablet 3  . tamsulosin (FLOMAX) 0.4 MG CAPS capsule Take 0.4 mg by mouth 2 (two) times daily.  5  . venlafaxine (EFFEXOR) 75 MG tablet Take 1 tablet (75 mg total) by mouth 2 (two) times daily. 180 tablet 3   No current facility-administered medications for this visit.    Physical Exam BP 114/79 mmHg  Pulse 72  Resp 20  Ht 5\' 8"  (1.727 m)  Wt 205 lb (92.987 kg)  BMI 31.18 kg/m2  SpO2 97% 64 yo man in NAD Well developed and well nourished Alert and oriented x 3 Wound intact with mild induration, no  erythema  Diagnostic Tests: SURGICAL PATHOLOGY ADDITIONAL INFORMATION: GMS, AFB, and PAS stains are negative for yeast, fungi, and Mycobacterium. (CRR:ds 08/15/14) [name] Pathologist, Electronic Signature ( Signed 08/15/2014) FINAL DIAGNOSIS Diagnosis 1. Lymph node, biopsy, 4R #2 - BENIGN LYMPH NODE WITH GRANULOMATOUS INFLAMMATION, SEE COMMENT. 2. Lymph node, biopsy, 4R - BENIGN LYMPH NODE WITH GRANULOMATOUS INFLAMMATION, SEE COMMENT. 3. Lymph node, biopsy, 4R #3 - BENIGN LYMPH NODE WITH GRANULOMATOUS INFLAMMATION, SEE COMMENT. 4. Lymph node, biopsy, L 7 - BENIGN LYMPH NODE WITH GRANULOMATOUS INFLAMMATION, SEE COMMENT. Microscopic Comment 1. -4. The lymph nodes in each part are extensively involved by non necrotizing epithelioid granulomatous inflammation with broad zones of hyalinization. GMS, AFB, and PAS immunostains are pending (part 1B) and will be reported in an addendum. (CRR:gt, 08/14/14) [name] Pathologist, Electronic Signature (Case signed 08/14/2014) Intraoperative Diagnosis 1. 4R LYMPH NODE, FROZEN SECTION DIAGNOSIS: GRANULOMATOUS INFLAMMATION (CRR) Total: 1 1 of  AFB and fungal stains negative, cultures NG to date  Bacterial cultures grew serratia and pseudomonas  Impression: [name] is a 64 yo man with hilar and mediastinal adenopathy and bilateral lung nodules. Biopsies of  his mediastinal lymph nodes showed non-caseating granulomas consistent with sarcoidosis. AFB and fungal cultures are negative so far.  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 There is always a chance that an indolent organism could grow out late on AFB or fungal cultures, but for now there is no evidence of that.   It is a little harder to know what to make of the serratia and pseudomonas which grew out of culture. Both of those can cause pneumonia, but would not cause granulomas. He is on CPAP and it is a possible source of colonization. He will change out his CPAP  machine just in case. A lab error is also a possibility, but I have to think that is pretty rare. In any event given that he does not have any symptoms c/w a GNR pneumonia, I Peters not think there is any indication to treat for that currently.  He also is asymptomatic from the standpoint of sarcoidosis. Consideration could be given to a trial of steroids to see if the adenopathy and lung nodules would improve or resolve. I will defer to [name] on that issue.  I Peters think we should scan him again in about 6 months to make sure the lung nodules are not getting bigger.  He is recovering well from his surgery. He may resume normal activities.  He and [name] also had questions about the CAD noted on PET. I told them that it is an indication of some atherosclerosis, but not necessarily of any significant CAD. He does not have any anginal symptoms and exercises regularly. I Peters not think he needs any further work up at this time. They will discuss that issue with [name] the next time they see him.  Plan:  Follow up with [name]  I will see him back in 6 months with a repeat chest CT

## 2014-09-02 ENCOUNTER — Encounter: Payer: Self-pay | Admitting: Internal Medicine

## 2014-09-02 ENCOUNTER — Ambulatory Visit (INDEPENDENT_AMBULATORY_CARE_PROVIDER_SITE_OTHER): Payer: Medicare Other | Admitting: Internal Medicine

## 2014-09-02 VITALS — BP 124/84 | HR 90 | Ht 68.0 in | Wt 205.0 lb

## 2014-09-02 DIAGNOSIS — R918 Other nonspecific abnormal finding of lung field: Secondary | ICD-10-CM

## 2014-09-02 DIAGNOSIS — I10 Essential (primary) hypertension: Secondary | ICD-10-CM

## 2014-09-02 NOTE — Assessment & Plan Note (Signed)
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

## 2014-09-02 NOTE — Patient Instructions (Signed)
Sarcoidosis is a benign inflammatory condition caused by  The  immune system being too revved up like a thermostat on your furnace that's partially  stuck causing arthitis, rash, short of breath and cough and vision issues.  It typically burns itself out in 75% of patients by the end of 3 years with little to indicate that we really change the natural course of the disease by aggressive treatments intended to alter it.  Treatment is generally reserved for patients with major symptoms (unexplained cough or shortness of breath)  we can attribute to sarcoid or if a vital organ (like the eye or kidney or nervous system) becomes affected.    Follow up should be in about 3 months then every 6 months thereafter for up to 3 years > Dr Everlene Farrier has agreed to do this but you can always return here too  You may need a substitute for your lisinopril if the throat clearing persists or cough starts before you return here   It can be treated with medication, but also with lifestyle changes including avoidance of late meals, excessive alcohol, smoking cessation, and avoid fatty foods, chocolate, peppermint, colas, red wine, and acidic juices such as orange juice.  NO MINT OR MENTHOL PRODUCTS SO NO COUGH DROPS  USE SUGARLESS CANDY INSTEAD (Jolley ranchers or Stover's or Life Savers) or even ice chips will also do - the key is to swallow to prevent all throat clearing. NO OIL BASED VITAMINS - use powdered substitutes.  See Dr Erik Obey and let him know about the bacteria which probably came from the machine or sinuses but you do not appear to have a lung infection   Pulmonary follow up is as needed

## 2014-09-02 NOTE — Assessment & Plan Note (Addendum)
ACE inhibitors are problematic in  pts with airway complaints because  even experienced pulmonologists can't always distinguish ace effects from copd/asthma.  By themselves they don't actually cause a problem, much like oxygen can't by itself start a fire, but they certainly serve as a powerful catalyst or enhancer for any "fire"  or inflammatory process in the upper airway, be it caused by an ET  tube or more commonly reflux (especially in the obese or pts with known GERD or who are on biphoshonates).    In the era of ARB near equivalency would have low threshold here to change to arb, esp while observing pt for the next 3 years for symptoms of sarcoid, especially cough > defer to Dr Everlene Farrier

## 2014-09-02 NOTE — Progress Notes (Signed)
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   07/28/2014 1st [facility] office visit/ [name]   Chief Complaint  Patient presents with  . Pulmonary Consult    Referred by [facility] ED for eval of pulmonary nodules. Pt went to ED on 07/27/14 with left flank pain and pulmonary nodules found incidentally on ct abdomen/pelvis.   back pain developed while on RT for prostate ca indolent onset and progressive  in Feb 2015 and radiating down R leg all the way to foot better with norco. rec Conservative f/u > pt requested second opinion > [facility] >08/13/14  EBUS > NCG and a few gnr's on washings    09/02/2014 f/u ov/[name] re: prob sarcoid Chief Complaint  Patient presents with  . Follow-up    Pt advised by Dr Roxan Hockey to f/u here after having Mediastinoscopy. This was done on 08/13/14. Pt states doing well and denies any co's today.    does have pnds/ freq throat on ACEi which he attributes to sinuses/ f/u [facility] for this and cpap machine  Not limited by breathing from desired activities    No obvious day to day or daytime variabilty or assoc  cp or chest tightness, subjective wheeze or overt    hb symptoms. No unusual exp hx or h/o childhood pna/ asthma or knowledge of premature birth.  Sleeping ok without nocturnal  or early am exacerbation  of respiratory  c/o's or need for noct saba. Also denies any obvious fluctuation of symptoms with weather or environmental changes or other aggravating or alleviating factors except as outlined above   Current Medications, Allergies, Complete Past Medical History, Past Surgical History, Family History, and Social History were reviewed in [employer]  record.  ROS  The following are not active complaints unless bolded sore throat, dysphagia, dental problems, itching, sneezing,  nasal congestion or excess/ purulent secretions, ear ache,   fever, chills, sweats, unintended wt loss, pleuritic or exertional cp, hemoptysis,  orthopnea pnd or leg swelling, presyncope, palpitations, heartburn, abdominal pain, anorexia, nausea, vomiting, diarrhea  or change in bowel or urinary habits, change in stools or urine, dysuria,hematuria,  rash, arthralgias, visual complaints, headache, numbness weakness or ataxia or problems with walking or coordination,  change in mood/affect or memory.              Objective:   Physical Exam  amb anxious  wm nad with freq throat clearing    09/02/2014          205  Wt Readings from Last 3 Encounters:  07/28/14 212 lb 9.6 oz (96.435 kg)  06/18/14 205 lb (92.987 kg)  01/31/14 209 lb (94.802 kg)    Vital signs reviewed  HEENT: nl dentition, turbinates, and orophanx. Nl external ear canals without cough reflex   NECK :  without JVD/Nodes/TM/ nl carotid upstrokes bilaterally   LUNGS: no acc muscle use, clear to A and P bilaterally without cough on insp or exp maneuvers   CV:  RRR  no s3 or murmur or increase in P2, no edema   ABD:  soft and nontender with nl excursion in the supine position. No bruits or organomegaly, bowel sounds nl  MS:  warm without deformities, calf tenderness, cyanosis or clubbing  SKIN: warm and dry without lesions    NEURO:  alert, approp, no deficits       Lab Results  Component Value Date   ESRSEDRATE 28* 08/02/2014       Assessment & Plan:

## 2014-09-12 LAB — FUNGUS CULTURE W SMEAR: FUNGAL SMEAR: NONE SEEN

## 2014-09-25 LAB — AFB CULTURE WITH SMEAR (NOT AT ARMC): ACID FAST SMEAR: NONE SEEN

## 2014-10-22 ENCOUNTER — Other Ambulatory Visit: Payer: Self-pay | Admitting: Emergency Medicine

## 2014-11-03 ENCOUNTER — Encounter (HOSPITAL_BASED_OUTPATIENT_CLINIC_OR_DEPARTMENT_OTHER): Payer: Medicare Other

## 2014-11-10 ENCOUNTER — Encounter (HOSPITAL_BASED_OUTPATIENT_CLINIC_OR_DEPARTMENT_OTHER): Payer: Medicare Other

## 2014-11-11 ENCOUNTER — Ambulatory Visit (HOSPITAL_BASED_OUTPATIENT_CLINIC_OR_DEPARTMENT_OTHER): Payer: Medicare Other | Attending: Otolaryngology | Admitting: Radiology

## 2014-11-11 VITALS — Ht 68.0 in | Wt 205.0 lb

## 2014-11-11 DIAGNOSIS — Z9989 Dependence on other enabling machines and devices: Secondary | ICD-10-CM

## 2014-11-11 DIAGNOSIS — G4733 Obstructive sleep apnea (adult) (pediatric): Secondary | ICD-10-CM | POA: Diagnosis not present

## 2014-11-11 DIAGNOSIS — G4710 Hypersomnia, unspecified: Secondary | ICD-10-CM | POA: Diagnosis present

## 2014-11-22 DIAGNOSIS — G4733 Obstructive sleep apnea (adult) (pediatric): Secondary | ICD-10-CM | POA: Diagnosis not present

## 2014-11-22 NOTE — Sleep Study (Signed)
    NAME: [name] DATE OF BIRTH:  [birth year] MEDICAL RECORD NUMBER [id number]  LOCATION: [facility]  PHYSICIAN: YOUNG,CLINTON D  DATE OF STUDY: 11/11/2014  SLEEP STUDY TYPE: Out of Center Sleep Test- nunattended                REFERRING PHYSICIAN: Jodi Marble, MD  INDICATION FOR STUDY: Hypersomnia with sleep apnea  EPWORTH SLEEPINESS SCORE:   7/24 HEIGHT: 5\' 8"  (172.7 cm)  WEIGHT: 92.987 kg (205 lb)    Body mass index is 31.18 kg/(m^2).  NECK SIZE: 17 in.  MEDICATIONS: Charted for review  IMPRESSION:  Severe obstructive sleep apnea/hypopnea syndrome, AHI 55.9 per hour. 334 total events scored including 24 central apneas, 39 obstructive apneas, 25 mixed apneas, 246 hypopneas. Sleep position was not reported. Oxygen desaturation to a nadir of 87% with average oxygen saturation 93% on room air. Mean heart rate 62/m.    RECOMMENDATION:  Scores in this range are usually addressed first with CPAP. On an individual basis other interventions might be appropriate.   Deneise Lever Diplomate, American Board of Sleep Medicine  ELECTRONICALLY SIGNED ON:  11/22/2014, 12:00 PM [place] PH: [phone]   FX: [phone] [place]

## 2014-11-24 ENCOUNTER — Telehealth: Payer: Self-pay | Admitting: Radiology

## 2014-11-24 NOTE — Telephone Encounter (Signed)
Spoke with Dr Noreene Filbert office. Had to leave message on nurses's machine to CB with regards to sleep study supplies and making sure they are supplying him with those.

## 2014-12-03 ENCOUNTER — Telehealth: Payer: Self-pay

## 2014-12-03 NOTE — Telephone Encounter (Signed)
Patient dropped off a form to be filled out by Dr Everlene Farrier ([place]). Per patient he needs it as soon as possible and request to be called when ready at [phone]. Patient wanted to let Dr Everlene Farrier know he has an appointment for his CPE on 03/24/15 but wants to see if Dr Everlene Farrier could possibly work him in sometime in June. He now has new insurance so its ok for him to be seen sooner I just don't see any openings in June. I have put the form in the TL clinical box at [address]ld[address].

## 2014-12-04 NOTE — Telephone Encounter (Signed)
Sorry Dr. Everlene Farrier, It is in your box now.

## 2014-12-04 NOTE — Telephone Encounter (Signed)
Patient's wife called to check the status of her husband's form. I believe she thinks the form is going to be ready within the next hour. I tried my best to explain that someone will call her when it is ready for pick up and she should wait for that phone before coming to the office.   [phone]

## 2014-12-04 NOTE — Telephone Encounter (Signed)
This note was not in my box. Please check with cam and see if there are any openings for a physical exam. I did not see the health examination certificate. If we cannot find it call the patient and have him refax it.

## 2014-12-04 NOTE — Telephone Encounter (Signed)
Left message letting the wife know. Left voicemail. Pt states he needs this for his job he starts on Monday. Just FYI.

## 2014-12-04 NOTE — Telephone Encounter (Signed)
Call the patient's wife and let her know these forms can take up to a week to complete I will try and get it done tomorrow and once completed we will give her a call.

## 2014-12-05 ENCOUNTER — Encounter: Payer: Self-pay | Admitting: Emergency Medicine

## 2014-12-05 NOTE — Telephone Encounter (Signed)
PT'S WIFE CALLED AGAIN TO CHECK ON THE STATUS OF THE PAPER

## 2014-12-05 NOTE — Telephone Encounter (Signed)
Dr. Everlene Farrier,  Do you know where we are at with this form? Pt was called yesterday and informed it can take up to a week to complete form, but she is wanting the form today.  Callan Yontz

## 2014-12-05 NOTE — Telephone Encounter (Signed)
PLEASE ATTN: FAX TO "MR. DUNCAN" [phone]

## 2014-12-05 NOTE — Telephone Encounter (Signed)
Pt's wife called again about the forms mentioned in previous messages.  I explained to her that Dr. Everlene Farrier will try his hardest to get those forms filled out ASAP. PLEASE FAX [phone]

## 2014-12-05 NOTE — Telephone Encounter (Signed)
Pt's wife called checking on status of this message. She sounds very distressed. Her husband needs this for his interview for today. Please advise her at [phone]

## 2014-12-10 NOTE — Telephone Encounter (Signed)
lmom that forms is ready to be picked up from the 102 building please make sure we make a copy of forms for his chart

## 2015-01-10 ENCOUNTER — Other Ambulatory Visit: Payer: Self-pay | Admitting: Emergency Medicine

## 2015-01-19 ENCOUNTER — Other Ambulatory Visit: Payer: Self-pay

## 2015-01-19 ENCOUNTER — Telehealth: Payer: Self-pay

## 2015-01-19 ENCOUNTER — Encounter: Payer: Self-pay | Admitting: Emergency Medicine

## 2015-01-19 NOTE — Telephone Encounter (Signed)
Message     Have you had any cancellations for DR Daub next week for a physical for [name] 1951/03/07. Please advise as soon as possible. as he is in town all next week.

## 2015-01-27 ENCOUNTER — Ambulatory Visit: Payer: Medicare Other

## 2015-01-28 ENCOUNTER — Ambulatory Visit (INDEPENDENT_AMBULATORY_CARE_PROVIDER_SITE_OTHER): Payer: Medicare Other

## 2015-01-28 ENCOUNTER — Ambulatory Visit (INDEPENDENT_AMBULATORY_CARE_PROVIDER_SITE_OTHER): Payer: Medicare Other | Admitting: Emergency Medicine

## 2015-01-28 VITALS — BP 128/80 | HR 72 | Temp 98.5°F | Resp 17 | Ht 66.5 in | Wt 200.0 lb

## 2015-01-28 DIAGNOSIS — Z1389 Encounter for screening for other disorder: Secondary | ICD-10-CM | POA: Diagnosis not present

## 2015-01-28 DIAGNOSIS — R269 Unspecified abnormalities of gait and mobility: Secondary | ICD-10-CM

## 2015-01-28 DIAGNOSIS — I10 Essential (primary) hypertension: Secondary | ICD-10-CM

## 2015-01-28 DIAGNOSIS — M25571 Pain in right ankle and joints of right foot: Secondary | ICD-10-CM

## 2015-01-28 DIAGNOSIS — R918 Other nonspecific abnormal finding of lung field: Secondary | ICD-10-CM

## 2015-01-28 DIAGNOSIS — Z0000 Encounter for general adult medical examination without abnormal findings: Secondary | ICD-10-CM | POA: Diagnosis not present

## 2015-01-28 DIAGNOSIS — C61 Malignant neoplasm of prostate: Secondary | ICD-10-CM

## 2015-01-28 DIAGNOSIS — R938 Abnormal findings on diagnostic imaging of other specified body structures: Secondary | ICD-10-CM

## 2015-01-28 DIAGNOSIS — F329 Major depressive disorder, single episode, unspecified: Secondary | ICD-10-CM

## 2015-01-28 DIAGNOSIS — E781 Pure hyperglyceridemia: Secondary | ICD-10-CM

## 2015-01-28 DIAGNOSIS — D126 Benign neoplasm of colon, unspecified: Secondary | ICD-10-CM

## 2015-01-28 DIAGNOSIS — Z23 Encounter for immunization: Secondary | ICD-10-CM | POA: Diagnosis not present

## 2015-01-28 DIAGNOSIS — F32A Depression, unspecified: Secondary | ICD-10-CM

## 2015-01-28 LAB — POCT CBC
Granulocyte percent: 72.8 %G (ref 37–80)
HEMATOCRIT: 46.5 % (ref 43.5–53.7)
HEMOGLOBIN: 15 g/dL (ref 14.1–18.1)
LYMPH, POC: 1.2 (ref 0.6–3.4)
MCH: 29.5 pg (ref 27–31.2)
MCHC: 32.4 g/dL (ref 31.8–35.4)
MCV: 91.2 fL (ref 80–97)
MID (cbc): 0.5 (ref 0–0.9)
MPV: 6.9 fL (ref 0–99.8)
POC GRANULOCYTE: 4.5 (ref 2–6.9)
POC LYMPH PERCENT: 19.8 %L (ref 10–50)
POC MID %: 7.4 %M (ref 0–12)
Platelet Count, POC: 220 10*3/uL (ref 142–424)
RBC: 5.1 M/uL (ref 4.69–6.13)
RDW, POC: 13.5 %
WBC: 6.2 10*3/uL (ref 4.6–10.2)

## 2015-01-28 LAB — POCT UA - MICROSCOPIC ONLY
Bacteria, U Microscopic: NEGATIVE
CRYSTALS, UR, HPF, POC: NEGATIVE
Casts, Ur, LPF, POC: NEGATIVE
Mucus, UA: POSITIVE
Yeast, UA: NEGATIVE

## 2015-01-28 LAB — POCT URINALYSIS DIPSTICK
Bilirubin, UA: NEGATIVE
Blood, UA: NEGATIVE
Glucose, UA: NEGATIVE
Ketones, UA: NEGATIVE
Leukocytes, UA: NEGATIVE
Nitrite, UA: NEGATIVE
PROTEIN UA: NEGATIVE
Spec Grav, UA: 1.025
UROBILINOGEN UA: 0.2
pH, UA: 5.5

## 2015-01-28 LAB — COMPLETE METABOLIC PANEL WITH GFR
ALBUMIN: 4.1 g/dL (ref 3.5–5.2)
ALK PHOS: 66 U/L (ref 39–117)
ALT: 15 U/L (ref 0–53)
AST: 19 U/L (ref 0–37)
BILIRUBIN TOTAL: 0.6 mg/dL (ref 0.2–1.2)
BUN: 17 mg/dL (ref 6–23)
CO2: 27 meq/L (ref 19–32)
Calcium: 9.4 mg/dL (ref 8.4–10.5)
Chloride: 106 mEq/L (ref 96–112)
Creat: 1.09 mg/dL (ref 0.50–1.35)
GFR, EST NON AFRICAN AMERICAN: 71 mL/min
GFR, Est African American: 82 mL/min
GLUCOSE: 107 mg/dL — AB (ref 70–99)
Potassium: 4.3 mEq/L (ref 3.5–5.3)
SODIUM: 142 meq/L (ref 135–145)
Total Protein: 6.3 g/dL (ref 6.0–8.3)

## 2015-01-28 LAB — LIPID PANEL
Cholesterol: 191 mg/dL (ref 0–200)
HDL: 31 mg/dL — ABNORMAL LOW (ref >=40)
LDL CALC: 115 mg/dL — AB (ref 0–99)
Total CHOL/HDL Ratio: 6.2 Ratio
Triglycerides: 226 mg/dL — ABNORMAL HIGH (ref <150)
VLDL: 45 mg/dL — AB (ref 0–40)

## 2015-01-28 LAB — TSH: TSH: 2.143 u[IU]/mL (ref 0.350–4.500)

## 2015-01-28 LAB — ANGIOTENSIN CONVERTING ENZYME: Angiotensin-Converting Enzyme: 6 U/L — ABNORMAL LOW (ref 8–52)

## 2015-01-28 MED ORDER — VENLAFAXINE HCL 75 MG PO TABS: 75.0000 mg | ORAL_TABLET | Freq: Two times a day (BID) | ORAL | Status: DC

## 2015-01-28 MED ORDER — LISINOPRIL 20 MG PO TABS: 10.0000 mg | ORAL_TABLET | Freq: Every day | ORAL | Status: DC

## 2015-01-28 MED ORDER — NIACIN ER (ANTIHYPERLIPIDEMIC) 500 MG PO TBCR: EXTENDED_RELEASE_TABLET | ORAL | Status: DC

## 2015-01-28 MED ORDER — FLUTICASONE PROPIONATE 50 MCG/ACT NA SUSP: 1.0000 | Freq: Every day | NASAL | Status: DC | PRN

## 2015-01-28 MED ORDER — AMLODIPINE BESYLATE 2.5 MG PO TABS: ORAL_TABLET | ORAL | Status: DC

## 2015-01-28 MED ORDER — TAMSULOSIN HCL 0.4 MG PO CAPS: ORAL_CAPSULE | ORAL | Status: DC

## 2015-01-28 NOTE — Progress Notes (Addendum)
Subjective:  This chart was scribed for Alan Queen, MD by Thea Alken, ED Scribe. This patient was seen in room 10 and the patient's care was started at 8:14 AM.   Patient ID: [name], male    DOB: March 16, 1951, 64 y.o.   MRN: [id number]  HPI Chief Complaint  Patient presents with  . Annual Exam   HPI Comments: [name] is a 64 y.o. male who presents to the [facility] for [facility] physical. 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  Pt has been seeing chiropractor for back pain. He reports possible back surgery with Dr. Charleen Kirks but reports improvement with pain.Marland Kitchen   He c/o of a knott on the arch of right foot. States knott causes radiating pain to right ankle and occasionally to right calf He has hx of plantar fascitis.   He reports trouble with balance. Pt states he feels unsteady with gait and wobbly. This could possibly relate to back pain.   He sees the skin doctor once a year. He is unsure of next PSA.  Past Medical History  Diagnosis Date  . Pure hyperglyceridemia   . Colon polyps   . Sleep apnea     wears CPAP  . Hypertension   . Kidney stones   . Allergy   . Arthritis   . Cataract   . Depression     no per pt  . Neuromuscular disorder     pinched nerve right arm  . Prostate cancer   . Complication of anesthesia   . PONV (postoperative nausea and vomiting)   . Anxiety   . Neck complaint     also with low back pain, following with 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 Retinal tear repair cryotherapy      right  . Knee surgery      both knees done- in 1970's & then on the other knee- 1994  . Kidney stone surgery    . Tonsillectomy and adenoidectomy    . Cataract extraction      left  . Colonoscopy    . Cardiac catheterization  1995    stress test  & clean cath done in [place]  . Eye surgery  2009   cataracts removed w/ IOL, retinal tear- 2009  . Gamma knife       procedure for Prostate  . Mediastinoscopy N/A 08/13/2014    Procedure: MEDIASTINOSCOPY;  Surgeon: [name], MD;  Location: [facility];  Service: Thoracic;  Laterality: N/A;   Prior to Admission medications   Medication Sig Start Date End Date Taking? Authorizing Provider  amLODipine (NORVASC) 10 MG tablet TAKE 1 TABLET BY MOUTH EVERY DAY 01/10/15  Yes Darlyne Russian, MD  aspirin EC 81 MG tablet Take 81 mg by mouth daily.   Yes Historical Provider, MD  cholecalciferol (VITAMIN D) 1000 UNITS tablet Take 1,000 Units by mouth daily.   Yes Historical Provider, MD  Cyanocobalamin (VITAMIN B-12 PO) Take 1,000 mcg by mouth daily.    Yes Historical Provider, MD  fluticasone (FLONASE) 50 MCG/ACT nasal spray Place 1 spray into both nostrils daily as needed (congestion).   Yes Historical Provider, MD  lisinopril (PRINIVIL,ZESTRIL) 20 MG tablet Take 0.5 tablets (10 mg total) by mouth daily. 08/13/14  Yes [name], MD  niacin (NIASPAN)  500 MG CR tablet Take 1 tablet (500 mg total) by mouth 2 (two) times daily. 08/13/14  Yes [name], MD  tamsulosin (FLOMAX) 0.4 MG CAPS capsule Take 0.4 mg by mouth 2 (two) times daily. 06/20/14  Yes Historical Provider, MD  venlafaxine (EFFEXOR) 75 MG tablet TAKE 1 TABLET BY MOUTH TWICE A DAY 10/22/14  Yes Darlyne Russian, MD   Review of Systems  Constitutional: Positive for activity change and fatigue.  Eyes: Positive for redness.  Genitourinary: Positive for difficulty urinating.  Musculoskeletal: Positive for back pain and arthralgias.  Allergic/Immunologic: Positive for environmental allergies.  All other systems reviewed and are negative.  Objective:   Physical Exam CONSTITUTIONAL: Well developed/well nourished HEAD: Normocephalic/atraumatic EYES: EOMI/PERRL ENMT: Mucous membranes moist well healed scar anterior to right ear NECK: supple no meningeal  signs SPINE/BACK:entire spine nontender CV: S1/S2 noted, no murmurs/rubs/gallops noted LUNGS: Lungs are clear to auscultation bilaterally, no apparent distress ABDOMEN: soft, nontender, no rebound or guarding, bowel sounds noted throughout abdomen GU:no cva tenderness prostate surgically absent NEURO: Pt is awake/alert/appropriate, moves all extremitiesx4.  No facial droop. 5 cm nodule on plantar surface mid right foot.   EXTREMITIES: pulses normal/equal, full ROM SKIN: warm, color normal PSYCH: no abnormalities of mood noted, alert and oriented to situation  Filed Vitals:   01/28/15 0809  BP: 128/80  Pulse: 72  Temp: 98.5 F (36.9 C)  TempSrc: Oral  Resp: 17  Height: 5' 6.5" (1.689 m)  Weight: 200 lb (90.719 kg)  SpO2: 98%   Results for orders placed or performed in visit on 01/28/15  POCT CBC  Result Value Ref Range   WBC 6.2 4.6 - 10.2 K/uL   Lymph, poc 1.2 0.6 - 3.4   POC LYMPH PERCENT 19.8 10 - 50 %L   MID (cbc) 0.5 0 - 0.9   POC MID % 7.4 0 - 12 %M   POC Granulocyte 4.5 2 - 6.9   Granulocyte percent 72.8 37 - 80 %G   RBC 5.10 4.69 - 6.13 M/uL   Hemoglobin 15.0 14.1 - 18.1 g/dL   HCT, POC 46.5 43.5 - 53.7 %   MCV 91.2 80 - 97 fL   MCH, POC 29.5 27 - 31.2 pg   MCHC 32.4 31.8 - 35.4 g/dL   RDW, POC 13.5 %   Platelet Count, POC 220 142 - 424 K/uL   MPV 6.9 0 - 99.8 fL  POCT UA - Microscopic Only  Result Value Ref Range   WBC, Ur, HPF, POC 1-3    RBC, urine, microscopic 0-3    Bacteria, U Microscopic neg    Mucus, UA pos    Epithelial cells, urine per micros 0-1    Crystals, Ur, HPF, POC neg    Casts, Ur, LPF, POC neg    Yeast, UA neg   POCT urinalysis dipstick  Result Value Ref Range   Color, UA yellow    Clarity, UA clear    Glucose, UA neg    Bilirubin, UA neg    Ketones, UA neg    Spec Grav, UA 1.025    Blood, UA neg    pH, UA 5.5    Protein, UA neg    Urobilinogen, UA 0.2    Nitrite, UA neg    Leukocytes, UA Negative Negative  [facility] reading  (PRIMARY) by  Dr. Everlene Farrier chest x-ray shows no acute disease. Film shows no calcification in the plantar area   Assessment & Plan:  1. Annual  physical exam  - POC Hemoccult Bld/Stl (3-Cd Home Screen); Future - POCT CBC - POCT UA - Microscopic Only - POCT urinalysis dipstick - COMPLETE METABOLIC PANEL WITH GFR - Lipid panel - TSH  2. Essential hypertension Blood pressure today was normal blood pressure yesterday was low and he has had some weak spells. Will decrease his amlodipine to 2.5 mg a day - POC Hemoccult Bld/Stl (3-Cd Home Screen); Future - POCT CBC - POCT UA - Microscopic Only - POCT urinalysis dipstick - COMPLETE METABOLIC PANEL WITH GFR - Lipid panel - TSH - lisinopril (PRINIVIL,ZESTRIL) 20 MG tablet; Take 0.5 tablets (10 mg total) by mouth daily.  Dispense: 45 tablet; Refill: 3 - fluticasone (FLONASE) 50 MCG/ACT nasal spray; Place 1 spray into both nostrils daily as needed (congestion).  Dispense: 16 g; Refill: 11  3. Abnormal CXR with multiple nodules Chest x-ray today is normal - Angiotensin converting enzyme - DG Chest 2 View; Future  4. Hypertriglyceridemia  - Lipid panel - niacin (NIASPAN) 500 MG CR tablet; One tab daily  Dispense: 90 tablet; Refill: 3  5. Prostate cancer This has been stable. He has not been back to the urologist recently. - tamsulosin (FLOMAX) 0.4 MG CAPS capsule; Take 1 tablet daily  Dispense: 90 capsule; Refill: 3  6. Pain in joint, ankle and foot, right Patient is a tender nodule on the bottom of his foot x-rays unremarkable. This would have to be treated by the orthopedist - DG Foot Complete Right; Future  7. Benign neoplasm of colon He states he is up-to-date on colonoscopy  8. Depression  - venlafaxine (EFFEXOR) 75 MG tablet; Take 1 tablet (75 mg total) by mouth 2 (two) times daily.  Dispense: 180 tablet; Refill: 3  9. Abnormality of gait His blood pressure may be low at times and I decreased his amlodipine. Will check CT head  to be sure this is okay - CT Head Wo Contrast; Future   I personally performed the services described in this documentation, which was scribed in my presence. The recorded information has been reviewed and is accurate.  Alan Queen, MD  [facility]  01/28/2015 3:46 PM

## 2015-01-29 ENCOUNTER — Encounter: Payer: Self-pay | Admitting: Family Medicine

## 2015-01-29 DIAGNOSIS — Z23 Encounter for immunization: Secondary | ICD-10-CM

## 2015-02-04 ENCOUNTER — Other Ambulatory Visit: Payer: Self-pay | Admitting: Thoracic Surgery (Cardiothoracic Vascular Surgery)

## 2015-02-04 DIAGNOSIS — R918 Other nonspecific abnormal finding of lung field: Secondary | ICD-10-CM

## 2015-02-04 LAB — POC HEMOCCULT BLD/STL (HOME/3-CARD/SCREEN)
Card #2 Fecal Occult Blod, POC: NEGATIVE
FECAL OCCULT BLD: NEGATIVE
FECAL OCCULT BLD: NEGATIVE

## 2015-02-04 NOTE — Addendum Note (Signed)
Addended by: Tommas Olp B on: 02/04/2015 04:33 PM   Modules accepted: Orders

## 2015-02-20 ENCOUNTER — Other Ambulatory Visit: Payer: Self-pay | Admitting: Emergency Medicine

## 2015-02-21 NOTE — Telephone Encounter (Signed)
Dr Everlene Farrier, you just saw pt for CPE and discussed pain, but I don't see this on pt's current list at [facility] so wanted to make sure you wanted to continue it for pt. It looks like you had Rxd for a whole year just over a year ago, so pt has probably been taking it all along and just ran out of RFs.

## 2015-03-10 ENCOUNTER — Other Ambulatory Visit: Payer: Medicare Other

## 2015-03-10 ENCOUNTER — Ambulatory Visit: Payer: Medicare Other | Admitting: Thoracic Surgery (Cardiothoracic Vascular Surgery)

## 2015-03-24 ENCOUNTER — Encounter: Payer: Medicare Other | Admitting: Emergency Medicine

## 2015-03-24 ENCOUNTER — Encounter: Payer: Self-pay | Admitting: Emergency Medicine

## 2015-03-27 ENCOUNTER — Telehealth: Payer: Self-pay

## 2015-03-27 NOTE — Telephone Encounter (Signed)
Pt's wife called today to ask if the Tamsulosin Rx can be re-written. It is currently written as "take one daily" and he was only given 90 pills to last him 90 days. However, he is supposed to take two of these pills a day; so it should be written for 180 pills. Pt will be completely out of this by Sunday.

## 2015-03-28 ENCOUNTER — Other Ambulatory Visit: Payer: Self-pay | Admitting: Emergency Medicine

## 2015-03-28 DIAGNOSIS — C61 Malignant neoplasm of prostate: Secondary | ICD-10-CM

## 2015-03-28 MED ORDER — TAMSULOSIN HCL 0.4 MG PO CAPS: ORAL_CAPSULE | ORAL | Status: DC

## 2015-03-28 NOTE — Telephone Encounter (Signed)
Call patient I have sent a prescription to the pharmacy.

## 2015-03-30 NOTE — Telephone Encounter (Signed)
Advised pt on VM.

## 2015-04-28 ENCOUNTER — Encounter: Payer: Self-pay | Admitting: Emergency Medicine

## 2015-05-15 ENCOUNTER — Telehealth: Payer: Self-pay

## 2015-05-15 NOTE — Telephone Encounter (Signed)
Left message for [name] to call back.  

## 2015-05-15 NOTE — Telephone Encounter (Signed)
Patient's wife Helene Kelp left voicemail today at [phone] requesting a call back. 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 Please call back at [phone] Helene Kelp).

## 2015-05-21 NOTE — Telephone Encounter (Signed)
Left another message to call back

## 2015-05-21 NOTE — Telephone Encounter (Signed)
Spoke with Helene Kelp. She no longer needs lab information. They were able to get information from [facility]. No other medical records requests at this time.

## 2015-10-22 IMAGING — PT NM PET TUM IMG INITIAL (PI) SKULL BASE T - THIGH
5 series · 25 of 25 positions shown · non-contrast
Comparison: Chest CT 07/27/2014.

CLINICAL DATA: Initial treatment strategy for multiple pulmonary
nodules.

EXAM:
NUCLEAR MEDICINE PET SKULL BASE TO THIGH
TECHNIQUE: 10.6 mCi F-18 FDG was injected intravenously. Full-ring PET imaging
was performed from the skull base to thigh after the radiotracer. CT
data was obtained and used for attenuation correction and anatomic
localization.
FASTING BLOOD GLUCOSE:  Value: 98 mg/dl

[Series 3: pet sk_thigh ac · axial · 5.0mm · 4.07mm/px · z∈[-1384,-464]mm · 8 of 231 slices shown]
[im 1/231]
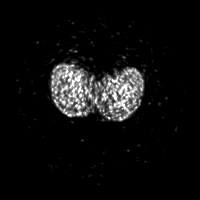
[im 33/231]
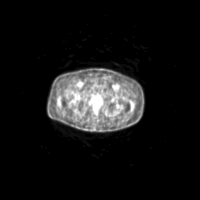
[im 66/231]
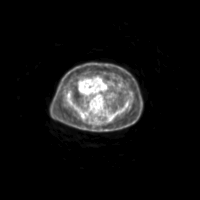
[im 99/231]
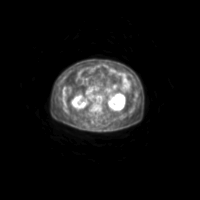
[im 132/231]
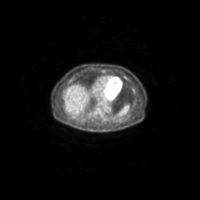
[im 165/231]
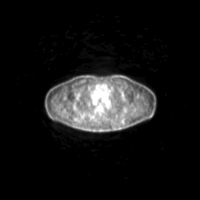
[im 198/231]
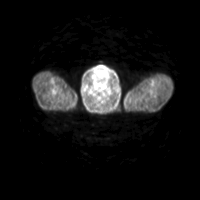
[im 231/231]
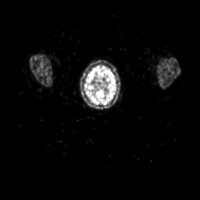

[Series 4: ct sk_thigh 5.0 b31f · axial · 5.0mm · 0.98mm/px · z∈[-1384,-464]mm · 7 of 231 slices shown]
[im 1/231]
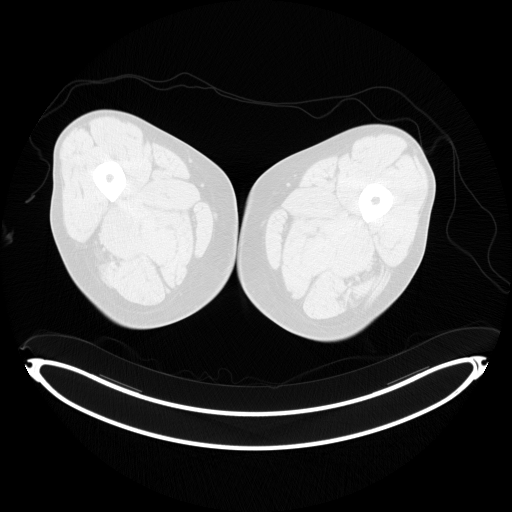
[im 39/231]
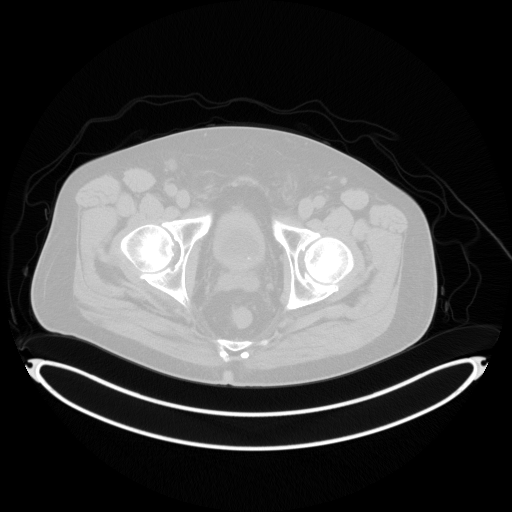
[im 77/231]
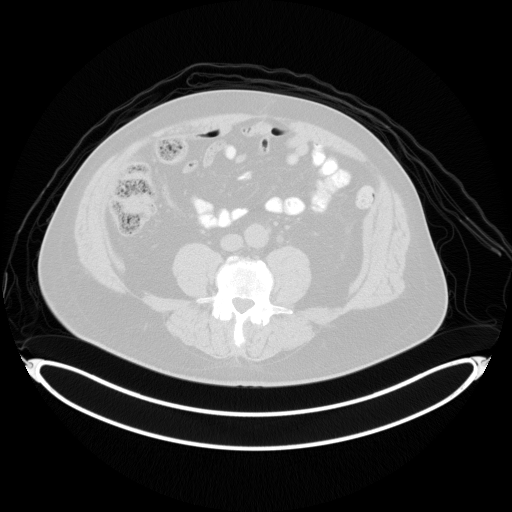
[im 116/231]
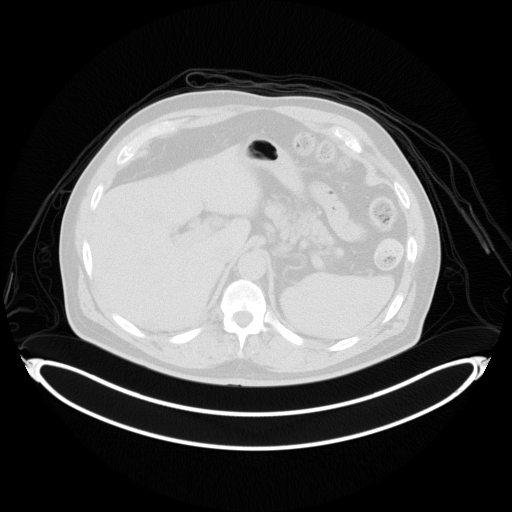
[im 154/231]
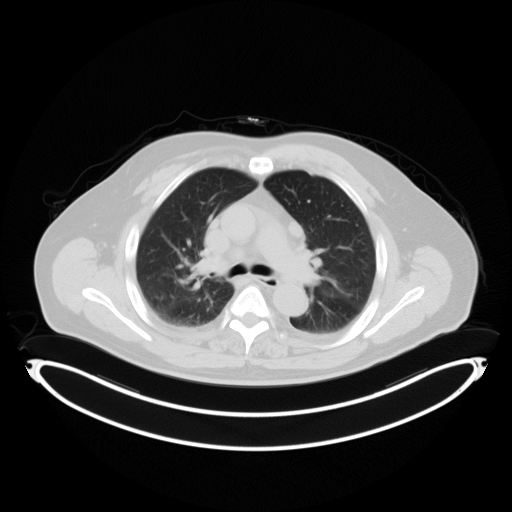
[im 192/231]
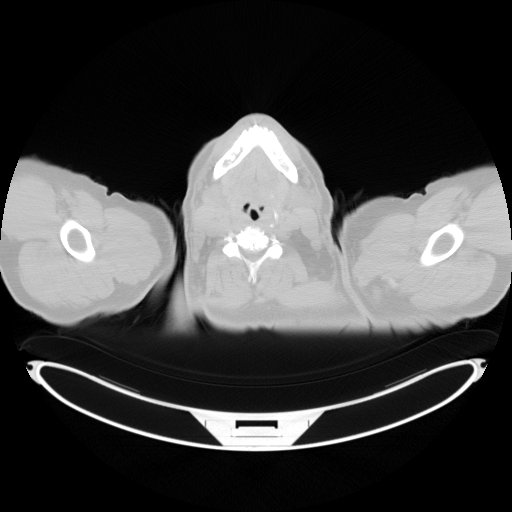
[im 231/231  brain]
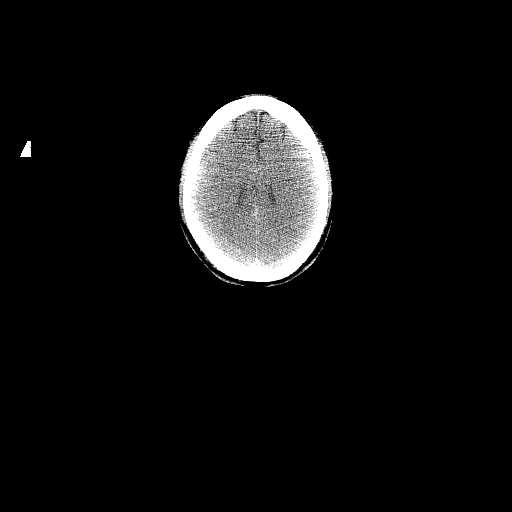

[Series 6: ct sk_thigh 5.0 b70f (id)_bone · axial · 5.0mm · 0.66mm/px · z∈[-934,-662]mm · 2 of 69 slices shown]
[im 1/69  bone]
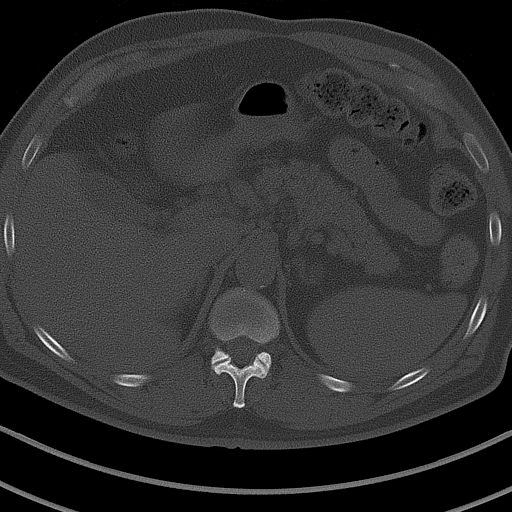
[im 69/69  bone]
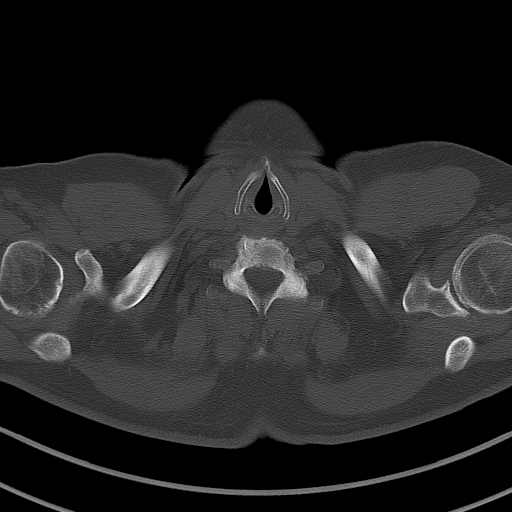

[Series 8: pet sk_thigh nac · axial · 5.0mm · 4.07mm/px · z∈[-1384,-464]mm · 7 of 231 slices shown]
[im 1/231]
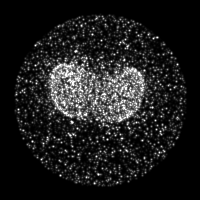
[im 39/231]
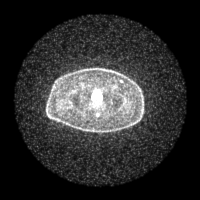
[im 77/231]
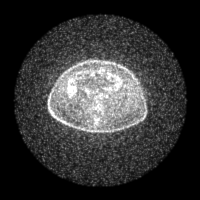
[im 116/231]
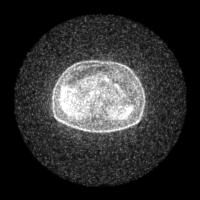
[im 154/231]
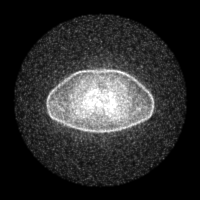
[im 192/231]
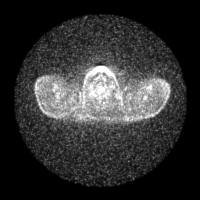
[im 231/231]
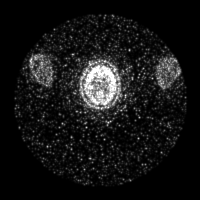

[Series 604: mip collection<mip range> · coronal · 1.91mm/px · 1 of 32 slices shown]
[im 1/32]
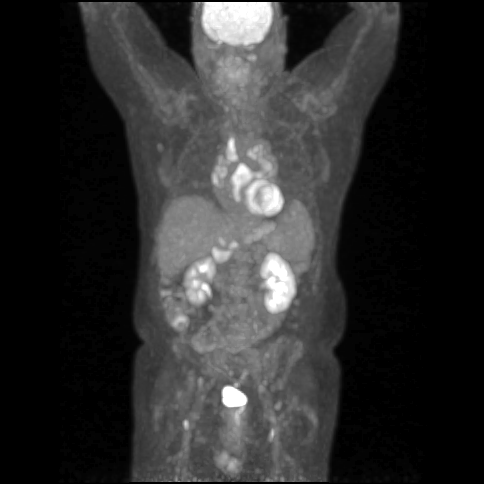

[25 of 25 positions shown; findings below may reference images not displayed]

FINDINGS: NECK

Nonenlarged mildly hypermetabolic right supraclavicular lymph node
(SUVmax = 3.7).

CHEST

The previously described 1 cm right upper lobe pulmonary nodule
demonstrates only low-level metabolic activity (SUVmax = 1.3). The
previously described left upper lobe pulmonary nodule also
demonstrates low-level metabolic activity (SUVmax = 2.3). Both of
these nodules of but the fissures. There is also a 3 mm subpleural
nodule in the periphery of the left upper lobe (image 26 of series
6). Multiple borderline enlarged and mildly enlarged lymph nodes in
the mediastinal and hilar stations are diffusely hypermetabolic.
Specific examples include a 1.3 cm short axis high right
paratracheal lymph node (SUVmax = 12), 1.6 cm short axis lower right
paratracheal lymph node (SUVmax = 8.4), and a cluster of lymph nodes
in the subcarinal nodal station measuring up to 1.4 cm in short axis
(SUVmax = 12.7). Heart size is normal. There is no significant
pericardial fluid, thickening or pericardial calcification. There is
atherosclerosis of the thoracic aorta, the great vessels of the
mediastinum and the coronary arteries, including calcified
atherosclerotic plaque in the left anterior descending coronary
artery. Faint calcifications of the aortic valve.

ABDOMEN/PELVIS

No abnormal hypermetabolic activity within the liver, pancreas,
adrenal glands, or spleen. Multiple borderline enlarged and mildly
enlarged upper abdominal lymph nodes, including a 14 mm short axis
hepatoduodenal ligament lymph node (SUVmax = 7.2), and a 10 x 41 mm
portacaval lymph node (SUVmax = 9.0). Several nonobstructive calculi
are present within the collecting systems of the kidneys
bilaterally, largest of which measures up to 5 mm in the interpolar
collecting system of the left kidney. There are two 2-3 mm calculi
present within the urinary bladder. In addition, at the left
ureterovesicular junction there is a 5 mm calculus (image 191 of
series 4). This is associated with mild proximal left
hydroureteronephrosis and left perinephric stranding, indicative of
obstruction at this time. Numerous colonic diverticulae are present,
particularly in the sigmoid colon, without surrounding inflammatory
changes to suggest an acute diverticulitis at this time. Fiducial
markers are noted in the region of the prostate gland. Multiple
borderline enlarged and mildly enlarged bilateral external iliac,
inguinal and femoral nodes are noted bilaterally, which demonstrate
hypermetabolism. The largest of these measures up to 11 mm in the
right superficial femoral nodal station (SUVmax = 8.6).

SKELETON

No focal hypermetabolic activity to suggest skeletal metastasis.
IMPRESSION: 1. Extensive hypermetabolic lymphadenopathy noted in the right
supraclavicular region, chest, abdomen and pelvis, as detailed
above. Findings are concerning for potential lymphoproliferative
disorder. However, given the subpleural nodules in the lungs
bilaterally, another differential consideration would include a
systemic disease such as sarcoidosis. Clinical correlation is
recommended, with consideration for bronchoscopy and nodal sampling
of the mediastinal and/or hilar lymph nodes if clinically
appropriate. Less likely, the pulmonary nodules may represent
primary bronchogenic malignancies. Regardless of the findings at
bronchoscopy, repeat noncontrast chest CT 3-6 months is recommended
to evaluate the stability of the pulmonary nodules.
2. 5 mm partially obstructive calculus at the left ureterovesicular
junction with mild proximal left hydroureteronephrosis. Multiple
nonobstructive calculi are present within the collecting systems of
the kidneys bilaterally, and 2 additional tiny 2-3 mm bladder
calculi are present.
3. Colonic diverticulosis without findings to suggest acute
diverticulitis at this time.
4. Atherosclerosis, including left anterior descending coronary
artery disease. Please note that although the presence of coronary
artery calcium documents the presence of coronary artery disease,
the severity of this disease and any potential stenosis cannot be
assessed on this non-gated CT examination. Assessment for potential
risk factor modification, dietary therapy or pharmacologic therapy
may be warranted, if clinically indicated.

## 2015-12-31 ENCOUNTER — Other Ambulatory Visit: Payer: Self-pay | Admitting: Emergency Medicine

## 2016-01-29 ENCOUNTER — Other Ambulatory Visit: Payer: Self-pay | Admitting: Emergency Medicine

## 2016-01-29 ENCOUNTER — Telehealth: Payer: Self-pay

## 2016-01-29 ENCOUNTER — Ambulatory Visit (INDEPENDENT_AMBULATORY_CARE_PROVIDER_SITE_OTHER): Payer: Medicare Other

## 2016-01-29 ENCOUNTER — Ambulatory Visit (INDEPENDENT_AMBULATORY_CARE_PROVIDER_SITE_OTHER): Payer: Medicare Other | Admitting: Emergency Medicine

## 2016-01-29 VITALS — BP 128/78 | HR 65 | Temp 98.4°F | Resp 16 | Ht 66.0 in | Wt 204.6 lb

## 2016-01-29 DIAGNOSIS — M25551 Pain in right hip: Secondary | ICD-10-CM

## 2016-01-29 DIAGNOSIS — I10 Essential (primary) hypertension: Secondary | ICD-10-CM | POA: Diagnosis not present

## 2016-01-29 DIAGNOSIS — Z23 Encounter for immunization: Secondary | ICD-10-CM | POA: Diagnosis not present

## 2016-01-29 DIAGNOSIS — Z91048 Other nonmedicinal substance allergy status: Secondary | ICD-10-CM

## 2016-01-29 DIAGNOSIS — R269 Unspecified abnormalities of gait and mobility: Secondary | ICD-10-CM

## 2016-01-29 DIAGNOSIS — R5383 Other fatigue: Secondary | ICD-10-CM

## 2016-01-29 DIAGNOSIS — F329 Major depressive disorder, single episode, unspecified: Secondary | ICD-10-CM

## 2016-01-29 DIAGNOSIS — F32A Depression, unspecified: Secondary | ICD-10-CM

## 2016-01-29 DIAGNOSIS — R918 Other nonspecific abnormal finding of lung field: Secondary | ICD-10-CM

## 2016-01-29 DIAGNOSIS — C61 Malignant neoplasm of prostate: Secondary | ICD-10-CM | POA: Diagnosis not present

## 2016-01-29 DIAGNOSIS — E781 Pure hyperglyceridemia: Secondary | ICD-10-CM | POA: Diagnosis not present

## 2016-01-29 DIAGNOSIS — Z0000 Encounter for general adult medical examination without abnormal findings: Secondary | ICD-10-CM

## 2016-01-29 DIAGNOSIS — Z9109 Other allergy status, other than to drugs and biological substances: Secondary | ICD-10-CM

## 2016-01-29 DIAGNOSIS — Z1159 Encounter for screening for other viral diseases: Secondary | ICD-10-CM

## 2016-01-29 LAB — POCT CBC
Granulocyte percent: 71.7 %G (ref 37–80)
HCT, POC: 46.1 % (ref 43.5–53.7)
Hemoglobin: 15.9 g/dL (ref 14.1–18.1)
Lymph, poc: 1.6 (ref 0.6–3.4)
MCH: 31.8 pg — AB (ref 27–31.2)
MCHC: 34.6 g/dL (ref 31.8–35.4)
MCV: 92.1 fL (ref 80–97)
MID (CBC): 0.5 (ref 0–0.9)
MPV: 7.1 fL (ref 0–99.8)
PLATELET COUNT, POC: 174 10*3/uL (ref 142–424)
POC Granulocyte: 5.4 (ref 2–6.9)
POC LYMPH %: 21.1 % (ref 10–50)
POC MID %: 7.2 %M (ref 0–12)
RBC: 5 M/uL (ref 4.69–6.13)
RDW, POC: 13 %
WBC: 7.6 10*3/uL (ref 4.6–10.2)

## 2016-01-29 LAB — TSH: TSH: 2.36 m[IU]/L (ref 0.40–4.50)

## 2016-01-29 MED ORDER — LISINOPRIL 20 MG PO TABS: 10.0000 mg | ORAL_TABLET | Freq: Every day | ORAL | Status: DC

## 2016-01-29 MED ORDER — MELOXICAM 15 MG PO TABS: 15.0000 mg | ORAL_TABLET | Freq: Every day | ORAL | Status: AC

## 2016-01-29 MED ORDER — ESCITALOPRAM OXALATE 10 MG PO TABS: ORAL_TABLET | ORAL | Status: DC

## 2016-01-29 MED ORDER — FLUTICASONE PROPIONATE 50 MCG/ACT NA SUSP: 1.0000 | Freq: Every day | NASAL | Status: AC | PRN

## 2016-01-29 MED ORDER — AMLODIPINE BESYLATE 2.5 MG PO TABS: 2.5000 mg | ORAL_TABLET | Freq: Every day | ORAL | Status: DC

## 2016-01-29 MED ORDER — NIACIN ER (ANTIHYPERLIPIDEMIC) 500 MG PO TBCR: EXTENDED_RELEASE_TABLET | ORAL | Status: DC

## 2016-01-29 MED ORDER — VENLAFAXINE HCL 75 MG PO TABS: ORAL_TABLET | ORAL | Status: DC

## 2016-01-29 NOTE — Progress Notes (Addendum)
Subjective:  This chart was scribed for Alan Queen MD, by [name], at [facility].  This patient was seen in room 10 and the patient's care was started at 11:03 AM.    Patient ID: [name], male    DOB: 1950-08-30, 65 y.o.   MRN: [id number]  Chief Complaint  Patient presents with  . Medication Refill    HPI HPI Comments: [name] is a 65 y.o. male with a history of prostate cancer and hypertension and hypertriglyceridemia  who presents to the [facility] for medication refill.  He is also complaining of having issues with his balance and a burning sensation alongside his right leg.  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  He is compliant with his meloxicam and blood pressure medication.  Patient is still having some fatigue occasionally. Patient will be seeing his doctor regarding his prostate in two weeks.   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 He has been in [place] for the past year.   When patient was seen in 08/27/14 by his cardiologist [name] (cardiothoracic surgeon), he was told to have a CT completed in August which patient was not able to get. [name] (pulmonologist), seen 09/02/14, advised patient to follow up in 3 months.    Patient Active Problem List   Diagnosis Date Noted  . Essential hypertension 09/02/2014  . Multiple pulmonary nodules 07/28/2014  . Bladder outlet obstruction 07/28/2014  . Prostate cancer ([place]) 01/31/2014  . Lumbar disc disease 01/31/2014  . ADENOMATOUS COLONIC POLYP 01/10/2007  . DIVERTICULOSIS OF COLON 01/10/2007   Past Medical History  Diagnosis Date  . Pure hyperglyceridemia   . Colon polyps   . Sleep apnea     wears CPAP  . Hypertension   . Kidney stones   . Allergy   . Arthritis   . Cataract     . Depression     no per pt  . Neuromuscular disorder ([name])     pinched nerve right arm  . Prostate cancer ([name])   . Complication of anesthesia   . PONV (postoperative nausea and vomiting)   . Anxiety   . Neck complaint     also with low back pain, following with 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 Retinal tear repair cryotherapy      right  . Knee surgery      both knees done- in 1970's & then on the other knee- 1994  . Kidney stone surgery    . Tonsillectomy and adenoidectomy    . Cataract extraction      left  . Colonoscopy    . Cardiac catheterization  1995    stress test  & clean cath done in [place]  . Eye surgery  2009    cataracts removed w/ IOL, retinal tear- 2009  . Gamma knife       procedure for Prostate  . Mediastinoscopy N/A 08/13/2014    Procedure: MEDIASTINOSCOPY;  Surgeon: [name], MD;  Location: [facility] OR;  Service: Thoracic;  Laterality: N/A;   No Known Allergies Prior to Admission medications   Medication Sig Start Date End Date Taking?  Authorizing Provider  amLODipine (NORVASC) 2.5 MG tablet TAKE 1 TABLET DAILY 12/31/15   Darlyne Russian, MD  aspirin EC 81 MG tablet Take 81 mg by mouth daily.    Historical Provider, MD  cholecalciferol (VITAMIN D) 1000 UNITS tablet Take 1,000 Units by mouth daily.    Historical Provider, MD  Cyanocobalamin (VITAMIN B-12 PO) Take 1,000 mcg by mouth daily.     Historical Provider, MD  fluticasone (FLONASE) 50 MCG/ACT nasal spray Place 1 spray into both nostrils daily as needed (congestion). 01/28/15   Darlyne Russian, MD  lisinopril (PRINIVIL,ZESTRIL) 20 MG tablet Take 0.5 tablets (10 mg total) by mouth daily. 01/28/15   Darlyne Russian, MD  meloxicam (MOBIC) 15 MG tablet TAKE 1 TABLET BY MOUTH EVERY DAY WITH FOOD 02/23/15   Darlyne Russian, MD  niacin (NIASPAN) 500 MG CR tablet One tab daily 01/28/15   Darlyne Russian, MD  tamsulosin  (FLOMAX) 0.4 MG CAPS capsule Take 1 tablet twice a day 03/28/15   Darlyne Russian, MD  venlafaxine (EFFEXOR) 75 MG tablet TAKE 1 TABLET (75 MG TOTAL) BY MOUTH 2 (TWO) TIMES DAILY. 12/31/15   Darlyne Russian, MD   Social History   Social History  . Marital Status: Married    Spouse Name: N/A  . Number of Children: 3  . Years of Education: N/A   Occupational History  . Football coach    Social History Main Topics  . Smoking status: Never Smoker   . Smokeless tobacco: Never Used  . Alcohol Use: Yes     Comment: social- seldom  . Drug Use: No  . Sexual Activity: Not on file   Other Topics Concern  . Not on file   Social History Narrative       Review of Systems  Constitutional: Positive for fatigue. Negative for fever and chills.  Eyes: Negative for pain, redness and itching.  Respiratory: Negative for cough, choking and shortness of breath.   Gastrointestinal: Negative for nausea and vomiting.  Musculoskeletal: Negative for neck pain and neck stiffness.  Skin: Negative for color change.  Neurological: Positive for dizziness. Negative for syncope and speech difficulty.       Objective:   Physical Exam Filed Vitals:   01/29/16 1059  BP: 128/78  Pulse: 65  Temp: 98.4 F (36.9 C)  TempSrc: Oral  Resp: 16  Height: 5\' 6"  (1.676 m)  Weight: 204 lb 9.6 oz (92.806 kg)  SpO2: 99%    CONSTITUTIONAL: Well developed/well nourished HEAD: Normocephalic/atraumatic EYES: EOMI/PERRL ENMT: Mucous membranes moist NECK: supple no meningeal signs SPINE/BACK:entire spine nontender CV: S1/S2 noted, no murmurs/rubs/gallops noted LUNGS: Lungs are clear to auscultation bilaterally, no apparent distress NEURO: Pt is awake/alert/appropriate, moves all extremitiesx4.  No facial droop.   EXTREMITIES:right hip- he has limited external and internal rotation.  SKIN: warm, color normal PSYCH: no abnormalities of mood noted, alert and oriented to situation  Results for orders placed or  performed in visit on 01/29/16  POCT CBC  Result Value Ref Range   WBC 7.6 4.6 - 10.2 K/uL   Lymph, poc 1.6 0.6 - 3.4   POC LYMPH PERCENT 21.1 10 - 50 %L   MID (cbc) 0.5 0 - 0.9   POC MID % 7.2 0 - 12 %M   POC Granulocyte 5.4 2 - 6.9   Granulocyte percent 71.7 37 - 80 %G   RBC 5.00 4.69 - 6.13 M/uL   Hemoglobin 15.9 14.1 - 18.1 g/dL  HCT, POC 46.1 43.5 - 53.7 %   MCV 92.1 80 - 97 fL   MCH, POC 31.8 (A) 27 - 31.2 pg   MCHC 34.6 31.8 - 35.4 g/dL   RDW, POC 13.0 %   Platelet Count, POC 174 142 - 424 K/uL   MPV 7.1 0 - 99.8 fL  Dg Chest 2 View  01/29/2016  CLINICAL DATA:  Pulmonary nodule EXAM: CHEST  2 VIEW COMPARISON:  01/28/2015 FINDINGS: Normal heart size and stable mild aortic tortuosity. History of mediastinal adenopathy by CT and PET January 2016; mediastinal contours are stable. There is no edema, consolidation, effusion, or pneumothorax. No visible nodule to correlate with January 2016 chest CT. IMPRESSION: No evidence of active disease. Stable compared to 1 year prior. Mediastinal adenopathy and lung nodules seen by CT 07/27/2014 are not visible radiographically. Electronically Signed   By: Monte Fantasia M.D.   On: 01/29/2016 11:58   Dg Hip Unilat W Or W/o Pelvis 2-3 Views Right  01/29/2016  CLINICAL DATA:  Burning sensation in the right leg, balance issues EXAM: DG HIP (WITH OR WITHOUT PELVIS) 2-3V RIGHT COMPARISON:  Coronal and sagittal CT images from a study of July 27, 2014 FINDINGS: The bones are subjectively mildly osteopenic. There is no lytic or blastic lesion. AP and lateral views of the right hip reveal preservation of the joint space. The articular surfaces of the femoral head and acetabulum remains smoothly rounded. The femoral neck, intertrochanteric, and subtrochanteric regions are normal. There is disc space narrowing and endplate osteophyte formation at L4-5. IMPRESSION: There is no acute or significant chronic bony abnormality of the right hip. Incidental note is made  of moderate degenerative change centered at the L4-5 level of the lumbar spine. Electronically Signed   By: David  Martinique M.D.   On: 01/29/2016 11:58       Assessment & Plan:  Patient has not been compliant regarding follow-up of his pulmonary nodules. He has not had a follow-up CT done. He did have complaints of problems with gait but did not have his follow-up CT. He is requesting a change off of his Effexor. I suspect most of his depression is due to his frequent work changes. 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 We'll wean off of Effexor and then start Lexapro. Other medications for refill. I did schedule him for CT chest as well as an appointment with [name]. He is also scheduled for CT of his head regarding his gait abnormalities.I personally performed the services described in this documentation, which was scribed in my presence. The recorded information has been reviewed and is accurate.  Darlyne Russian, MD

## 2016-01-29 NOTE — Patient Instructions (Signed)
     IF you received an x-ray today, you will receive an invoice from [facility]. Please contact [facility] at [phone] with questions or concerns regarding your invoice.   IF you received labwork today, you will receive an invoice from [facility]. Please contact [facility] at [phone] with questions or concerns regarding your invoice.   Our billing staff will not be able to assist you with questions regarding bills from these companies.  You will be contacted with the lab results as soon as they are available. The fastest way to get your results is to activate your My Chart account. Instructions are located on the last page of this paperwork. If you have not heard from us regarding the results in 2 weeks, please contact this office.      

## 2016-01-29 NOTE — Telephone Encounter (Signed)
Pt was seen  Today and labs were drawn and patient is wanting to know if a PSA can be added, and results be sent to dr Arman Bogus  Best [phone]

## 2016-01-30 LAB — COMPLETE METABOLIC PANEL WITH GFR
ALK PHOS: 60 U/L (ref 40–115)
ALT: 13 U/L (ref 9–46)
AST: 18 U/L (ref 10–35)
Albumin: 4.1 g/dL (ref 3.6–5.1)
BUN: 13 mg/dL (ref 7–25)
CALCIUM: 9.2 mg/dL (ref 8.6–10.3)
CHLORIDE: 104 mmol/L (ref 98–110)
CO2: 21 mmol/L (ref 20–31)
Creat: 1.15 mg/dL (ref 0.70–1.25)
GFR, Est African American: 76 mL/min (ref >=60)
GFR, Est Non African American: 66 mL/min (ref >=60)
Glucose, Bld: 96 mg/dL (ref 65–99)
Potassium: 4.3 mmol/L (ref 3.5–5.3)
Sodium: 139 mmol/L (ref 135–146)
Total Bilirubin: 0.7 mg/dL (ref 0.2–1.2)
Total Protein: 6.3 g/dL (ref 6.1–8.1)

## 2016-01-30 LAB — SEDIMENTATION RATE: SED RATE: 1 mm/h (ref 0–20)

## 2016-01-30 LAB — LIPID PANEL
CHOL/HDL RATIO: 5 ratio (ref <=5.0)
CHOLESTEROL: 166 mg/dL (ref 125–200)
HDL: 33 mg/dL — ABNORMAL LOW (ref >=40)
LDL Cholesterol: 92 mg/dL (ref <130)
Triglycerides: 203 mg/dL — ABNORMAL HIGH (ref <150)
VLDL: 41 mg/dL — ABNORMAL HIGH (ref <30)

## 2016-01-30 LAB — VITAMIN D 25 HYDROXY (VIT D DEFICIENCY, FRACTURES): VIT D 25 HYDROXY: 38 ng/mL (ref 30–100)

## 2016-01-30 LAB — HEPATITIS C ANTIBODY: HCV AB: NEGATIVE

## 2016-02-01 LAB — ANGIOTENSIN CONVERTING ENZYME: ANGIOTENSIN-CONVERTING ENZYME: 3 U/L — AB (ref 8–52)

## 2016-02-01 LAB — PSA: PSA: 8.23 ng/mL — AB (ref <=4.00)

## 2016-02-02 ENCOUNTER — Telehealth: Payer: Self-pay

## 2016-02-02 NOTE — Telephone Encounter (Signed)
Pt does have questions about his lab work that he would like to talk over with dr Everlene Farrier  Best number [phone]

## 2016-02-03 NOTE — Telephone Encounter (Signed)
I called and spoke with patient's wife. I advised her of the elevated PSA. I told her I would be happy to talk with Mr. Amirian after he sees the urologist.

## 2016-02-05 ENCOUNTER — Telehealth: Payer: Self-pay

## 2016-02-05 NOTE — Telephone Encounter (Signed)
Wife called asking if we can fax over his PSA lab results to [facility]. I did make sure with Dr. Everlene Farrier that, that was ok and he stated it was, the lab was faxed

## 2016-02-05 NOTE — Telephone Encounter (Signed)
Pt is needing to talk with dr Everlene Farrier about how his visit went with the urologist -pt is very concerned that the cancer might have come back   Please call [phone]

## 2016-02-06 NOTE — Telephone Encounter (Signed)
Patient called and message left on answering machine.

## 2016-02-06 NOTE — Telephone Encounter (Signed)
Do you want me to ask more questions or do you just want to call?

## 2016-02-08 ENCOUNTER — Ambulatory Visit: Payer: Medicare Other | Admitting: Internal Medicine

## 2016-02-09 ENCOUNTER — Ambulatory Visit
Admission: RE | Admit: 2016-02-09 | Discharge: 2016-02-09 | Disposition: A | Discharge status: Home / Self Care | Payer: Medicare Other | Source: Ambulatory Visit | Attending: Emergency Medicine | Admitting: Emergency Medicine

## 2016-02-09 DIAGNOSIS — R269 Unspecified abnormalities of gait and mobility: Secondary | ICD-10-CM

## 2016-02-09 DIAGNOSIS — R918 Other nonspecific abnormal finding of lung field: Secondary | ICD-10-CM

## 2016-02-15 ENCOUNTER — Other Ambulatory Visit (HOSPITAL_COMMUNITY): Payer: Self-pay | Admitting: Urology

## 2016-02-15 DIAGNOSIS — C61 Malignant neoplasm of prostate: Secondary | ICD-10-CM

## 2016-02-23 ENCOUNTER — Encounter (HOSPITAL_COMMUNITY)
Admission: RE | Admit: 2016-02-23 | Discharge: 2016-02-23 | Disposition: A | Discharge status: Home / Self Care | Payer: BC Managed Care – PPO | Source: Ambulatory Visit | Attending: Urology | Admitting: Urology

## 2016-02-23 DIAGNOSIS — C61 Malignant neoplasm of prostate: Secondary | ICD-10-CM | POA: Insufficient documentation

## 2016-02-24 ENCOUNTER — Other Ambulatory Visit (HOSPITAL_COMMUNITY): Payer: Self-pay | Admitting: Urology

## 2016-02-24 ENCOUNTER — Encounter: Payer: Self-pay | Admitting: Emergency Medicine

## 2016-02-24 DIAGNOSIS — R0989 Other specified symptoms and signs involving the circulatory and respiratory systems: Secondary | ICD-10-CM

## 2016-03-02 ENCOUNTER — Other Ambulatory Visit: Payer: Self-pay | Admitting: Radiology

## 2016-03-03 ENCOUNTER — Encounter (HOSPITAL_COMMUNITY): Payer: Self-pay

## 2016-03-03 ENCOUNTER — Ambulatory Visit (HOSPITAL_COMMUNITY)
Admission: RE | Admit: 2016-03-03 | Discharge: 2016-03-03 | Disposition: A | Discharge status: Home / Self Care | Payer: BC Managed Care – PPO | Source: Ambulatory Visit | Attending: Urology | Admitting: Urology

## 2016-03-03 DIAGNOSIS — R0989 Other specified symptoms and signs involving the circulatory and respiratory systems: Secondary | ICD-10-CM

## 2016-03-03 DIAGNOSIS — C774 Secondary and unspecified malignant neoplasm of inguinal and lower limb lymph nodes: Secondary | ICD-10-CM | POA: Insufficient documentation

## 2016-03-03 LAB — PROTIME-INR
INR: 1.05
Prothrombin Time: 13.7 seconds (ref 11.4–15.2)

## 2016-03-03 LAB — APTT: aPTT: 33 seconds (ref 24–36)

## 2016-03-03 LAB — CBC
HCT: 46.1 % (ref 39.0–52.0)
HEMOGLOBIN: 15.5 g/dL (ref 13.0–17.0)
MCH: 31.6 pg (ref 26.0–34.0)
MCHC: 33.6 g/dL (ref 30.0–36.0)
MCV: 93.9 fL (ref 78.0–100.0)
PLATELETS: 168 10*3/uL (ref 150–400)
RBC: 4.91 MIL/uL (ref 4.22–5.81)
RDW: 12.6 % (ref 11.5–15.5)
WBC: 6.4 10*3/uL (ref 4.0–10.5)

## 2016-03-03 MED ORDER — FENTANYL CITRATE (PF) 100 MCG/2ML IJ SOLN
INTRAMUSCULAR | Status: AC | PRN
  Administered 2016-03-03: 50 ug via INTRAVENOUS

## 2016-03-03 MED ORDER — FENTANYL CITRATE (PF) 100 MCG/2ML IJ SOLN
INTRAMUSCULAR | Status: AC
  Filled 2016-03-03: qty 2

## 2016-03-03 MED ORDER — LIDOCAINE HCL (PF) 1 % IJ SOLN
INTRAMUSCULAR | Status: AC
  Filled 2016-03-03: qty 10

## 2016-03-03 MED ORDER — HYDROCODONE-ACETAMINOPHEN 5-325 MG PO TABS: 1.0000 | ORAL_TABLET | ORAL | Status: DC | PRN

## 2016-03-03 MED ORDER — MIDAZOLAM HCL 2 MG/2ML IJ SOLN
INTRAMUSCULAR | Status: AC
  Filled 2016-03-03: qty 2

## 2016-03-03 MED ORDER — SODIUM CHLORIDE 0.9 % IV SOLN: INTRAVENOUS | Status: DC

## 2016-03-03 MED ORDER — SODIUM CHLORIDE 0.9 % IV SOLN
INTRAVENOUS | Status: AC | PRN
  Administered 2016-03-03: 10 mL/h via INTRAVENOUS

## 2016-03-03 MED ORDER — MIDAZOLAM HCL 2 MG/2ML IJ SOLN
INTRAMUSCULAR | Status: AC | PRN
  Administered 2016-03-03: 1 mg via INTRAVENOUS

## 2016-03-03 NOTE — Procedures (Signed)
US guided core biopsies (6) of enlarged right inguinal lymph node.  No immediate complication and minimal blood loss.  See full report in PACS.

## 2016-03-03 NOTE — Discharge Instructions (Signed)
Needle Biopsy, Care After °Refer to this sheet in the next few weeks. These instructions provide you with information about caring for yourself after your procedure. Your health care provider may also give you more specific instructions. Your treatment has been planned according to current medical practices, but problems sometimes occur. Call your health care provider if you have any problems or questions after your procedure. °WHAT TO EXPECT AFTER THE PROCEDURE °After your procedure, it is common to have soreness, bruising, or mild pain at the biopsy site. This should go away in a few days. °HOME CARE INSTRUCTIONS °· Rest as directed by your health care provider. °· Take medicines only as directed by your health care provider. °· There are many different ways to close and cover the biopsy site, including stitches (sutures), skin glue, and adhesive strips. Follow your health care provider's instructions about: °¨ Biopsy site care. °¨ Bandage (dressing) changes and removal. °¨ Biopsy site closure removal. °· Check your biopsy site every day for signs of infection. Watch for: °¨ Redness, swelling, or pain. °¨ Fluid, blood, or pus. °SEEK MEDICAL CARE IF: °· You have a fever. °· You have redness, swelling, or pain at the biopsy site that lasts longer than a few days. °· You have fluid, blood, or pus coming from the biopsy site. °· You feel nauseous. °· You vomit. °SEEK IMMEDIATE MEDICAL CARE IF: °· You have shortness of breath. °· You have trouble breathing. °· You have chest pain.   °· You feel dizzy or you faint. °· You have bleeding that does not stop with pressure or a bandage. °· You cough up blood. °· You have pain in your abdomen. °  °This information is not intended to replace advice given to you by your health care provider. Make sure you discuss any questions you have with your health care provider. °  °Document Released: 11/25/2014 Document Reviewed: 11/25/2014 °Elsevier Interactive Patient Education ©2016  [employer] ° °

## 2016-03-03 NOTE — H&P (Signed)
Chief Complaint: Patient was seen in consultation today for right inguinal lymph node biopsy at the request of [place]  Referring Physician(s): Kathie Rhodes  Supervising Physician: Markus Daft  Patient Status: Outpatient  History of Present Illness: [name] is a 65 y.o. male   Pt with Hx Prostate Ca Known sarcoidosis/Known pulmonary nodules Rising PSA per Dr Karsten Ro  PET 02/23/16: IMPRESSION: 1. Multiple bilateral iliac and inguinal pelvic lymph nodes which have intense Fluciclovine radiotracer accumulation consists with prostate cancer local nodal metastasis. Nodes are also increased in size from PET-CT of 07/31/2014. Lesions most accessible for biopsy would include RIGHT inguinal nodes. 2. New small 6 mm perirectal lymph node is also highly suspicious for metastasis. 3. Of note, the interpretation is difficult in that the patient has accompanying lymphadenopathy associated with sarcoidosis ; however the mediastinal lymphadenopathy which accumulates FDG activity, does not accumulate the fluciclovine amino acid radiotracer. 4. No evidence of metastatic adenopathy outside of the pelvis. 5. Stable bilateral pulmonary nodules and mediastinal adenopathy are consistent with sarcoidosis.  Request for Rt Inguinal node bx per Dr Karsten Ro Scheduled now for same   Past Medical History:  Diagnosis Date  . Allergy   . Anxiety   . Arthritis   . Cataract   . Colon polyps   . Complication of anesthesia   . Depression    no per pt  . Hypertension   . Kidney stones   . Neck complaint    also with low back pain, following with 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 Neuromuscular disorder ([place])    pinched nerve right arm  . PONV (postoperative nausea and vomiting)   . Prostate cancer ([place])   . Pure hyperglyceridemia   . Sleep apnea    wears CPAP    Past Surgical History:  Procedure Laterality Date  . CARDIAC  CATHETERIZATION  1995   stress test  & clean cath done in [place]  . CATARACT EXTRACTION     left  . COLONOSCOPY    . EYE SURGERY  2009   cataracts removed w/ IOL, retinal tear- 2009  . gamma knife      procedure for Prostate  . KIDNEY STONE SURGERY    . KNEE SURGERY     both knees done- in 1970's & then on the other knee- 1994  . MEDIASTINOSCOPY N/A 08/13/2014   Procedure: MEDIASTINOSCOPY;  Surgeon: [name], MD;  Location: [facility];  Service: Thoracic;  Laterality: N/A;  . RETINAL TEAR REPAIR CRYOTHERAPY     right  . TONSILLECTOMY AND ADENOIDECTOMY      Allergies: Review of patient's allergies indicates no known allergies.  Medications: Prior to Admission medications   Medication Sig Start Date End Date Taking? Authorizing Provider  amLODipine (NORVASC) 2.5 MG tablet Take 1 tablet (2.5 mg total) by mouth daily. 01/29/16  Yes Darlyne Russian, MD  aspirin EC 81 MG tablet Take 81 mg by mouth daily.   Yes Historical Provider, MD  cholecalciferol (VITAMIN D) 1000 UNITS tablet Take 1,000 Units by mouth daily.   Yes Historical Provider, MD  ibuprofen (ADVIL,MOTRIN) 200 MG tablet Take 400 mg by mouth every 6 (six) hours as needed (pain).   Yes Historical Provider, MD  Inositol Niacinate (NIACIN FLUSH FREE) 500 MG CAPS Take 500 mg by mouth 2 (two) times daily. OTC   Yes Historical Provider, MD  lisinopril (PRINIVIL,ZESTRIL) 20 MG tablet Take  0.5 tablets (10 mg total) by mouth daily. 01/29/16  Yes Darlyne Russian, MD  meloxicam (MOBIC) 15 MG tablet Take 1 tablet (15 mg total) by mouth daily. with food Patient taking differently: Take 15 mg by mouth daily as needed (arthritis/ knee pain). with food 01/29/16  Yes Darlyne Russian, MD  tamsulosin (FLOMAX) 0.4 MG CAPS capsule Take 1 tablet twice a day Patient taking differently: Take 0.4 mg by mouth 2 (two) times daily.  03/28/15  Yes Darlyne Russian, MD  venlafaxine (EFFEXOR) 75 MG tablet Take 1 tablet daily for 1 week then 1 tablet every other day  for one week then 1 tablet every 3 days for one week then off. Patient taking differently: Take 75 mg by mouth See admin instructions. Take 1 tablet (75 mg) by mouth 2 times daily. When ready to change to Lexapro taper down as follows: take 1 tablet (75 mg) daily for 1 week, then 1 tablet every other day for 1 week, then 1 tablet every 3 days for one week, then stop. 01/29/16  Yes Darlyne Russian, MD  vitamin B-12 (CYANOCOBALAMIN) 1000 MCG tablet Take 1,000 mcg by mouth daily.   Yes Historical Provider, MD  escitalopram (LEXAPRO) 10 MG tablet Start medication in one month. one tablet daily. Patient taking differently: Take 10 mg by mouth daily. To start after tapering off effexor 01/29/16   Darlyne Russian, MD  fluticasone [facility]) 50 MCG/ACT nasal spray Place 1 spray into both nostrils daily as needed (congestion). 01/29/16   Darlyne Russian, MD  niacin (NIASPAN) 500 MG CR tablet One tab daily Patient not taking: Reported on 02/29/2016 01/29/16   Darlyne Russian, MD  niacin 500 MG tablet Take 500 mg by mouth 2 (two) times daily.    Historical Provider, MD     Family History  Problem Relation Age of Onset  . Clotting disorder Mother   . Colon polyps Father   . Diabetes Father   . Colon cancer Neg Hx   . Esophageal cancer Neg Hx   . Stomach cancer Neg Hx   . Rectal cancer Neg Hx     Social History   Social History  . Marital status: Married    Spouse name: N/A  . Number of children: 3  . Years of education: N/A   Occupational History  . Football coach [employer]   Social History Main Topics  . Smoking status: Never Smoker  . Smokeless tobacco: Never Used  . Alcohol use Yes     Comment: social- seldom  . Drug use: No  . Sexual activity: Not Asked   Other Topics Concern  . None   Social History Narrative  . None     Review of Systems: A 12 point ROS discussed and pertinent positives are indicated in the HPI above.  All other systems are negative.  Review of Systems    Constitutional: Negative for activity change, appetite change, fatigue, fever and unexpected weight change.  Respiratory: Negative for cough and shortness of breath.   Gastrointestinal: Negative for abdominal pain.  Psychiatric/Behavioral: Negative for behavioral problems and confusion.    Vital Signs: BP 120/83 (BP Location: Left Arm)   Pulse (!) 54   Temp 97.7 F (36.5 C) (Oral)   Resp (!) 21   Ht 5\' 8"  (1.727 m)   Wt 202 lb (91.6 kg)   SpO2 97%   BMI 30.71 kg/m   Physical Exam  Constitutional: He is oriented to  person, place, and time.  Cardiovascular: Normal rate, regular rhythm and normal heart sounds.   Pulmonary/Chest: Effort normal and breath sounds normal. He has no wheezes.  Abdominal: Soft. Bowel sounds are normal. There is no tenderness.  Musculoskeletal: Normal range of motion.  Neurological: He is alert and oriented to person, place, and time.  Skin: Skin is warm and dry.  Psychiatric: He has a normal mood and affect. His behavior is normal. Judgment and thought content normal.  Nursing note and vitals reviewed.   Mallampati Score:  MD Evaluation Airway: WNL Heart: WNL Abdomen: WNL Chest/ Lungs: WNL ASA  Classification: 2 Mallampati/Airway Score: One  Imaging: Ct Head Wo Contrast  Result Date: 02/09/2016 CLINICAL DATA:  65 year old male with gait abnormality. History of prostate cancer. Initial encounter. EXAM: CT HEAD WITHOUT CONTRAST TECHNIQUE: Contiguous axial images were obtained from the base of the skull through the vertex without intravenous contrast. COMPARISON:  07/27/2014 head CT. FINDINGS: No intracranial hemorrhage or CT evidence of large acute infarct. No age advanced atrophy or hydrocephalus. No intracranial mass lesion noted on this unenhanced exam. No sclerotic focus of the calvarium or skullbase. Post banding right globe without acute orbital abnormality noted. Mastoid air cells, middle ear cavities and visualized paranasal sinuses are  clear. IMPRESSION: No acute intracranial abnormality as noted above. Electronically Signed   By: Genia Del M.D.   On: 02/09/2016 17:39   Ct Chest Wo Contrast  Result Date: 02/09/2016 CLINICAL DATA:  Follow-up pulmonary nodules in the right middle and left upper lobes. History of prostate cancer. EXAM: CT CHEST WITHOUT CONTRAST TECHNIQUE: Multidetector CT imaging of the chest was performed following the standard protocol without IV contrast. COMPARISON:  Chest radiograph 01/29/2016 and chest CT 07/28/2015 and PET/CT 07/31/14 FINDINGS: Cardiovascular: There is left anterior descending coronary artery atherosclerosis and atherosclerotic calcification within the thoracic aorta. Heart size is within normal limits. Trace pericardial effusion, increased from prior. Mediastinum/Nodes: Numerous enlarged mediastinal lymph nodes are again seen. --right level 4 are node again measures 1.2 cm (series 2, image 46) --the largest level 6 node measures 1.1 cm, unchanged -- peritracheal node at the level of the carina measures 1.5 cm, unchanged (series 2, image 60) --right hilar node measures 1.7 cm, unchanged (series 2, image 78) There is no axillary adenopathy. Lungs/Pleura: There is no pleural effusion or pneumothorax. Nodule along the right minor fissure is unchanged in size measuring 9 mm (series 3, image 83). Two small nodules at the left lung base are unchanged, measuring up to 6 mm (series 3, image 95). Tiny nodule along the left fissure is unchanged. Left upper lobe nodule is unchanged in size, measuring 8 mm (series 3, image 53). Upper Abdomen: 4-5 mm calcification within the midportion of the left kidney. Unremarkable visualized upper abdomen. Musculoskeletal: No lytic or blastic osseous lesions. Multilevel spondylosis. IMPRESSION: 1. Unchanged appearance of multiple pulmonary nodules. Recommend continued follow-up with chest CT in 6 months. 2. Unchanged appearance of multiple enlarged mediastinal lymph nodes. These  findings are compatible with granulomatous disease, as reported on operative note of 08/13/2014. Electronically Signed   By: Ulyses Jarred M.D.   On: 02/09/2016 19:50   Nm Pet Image Initial (pi) Skull Base To Thigh  Result Date: 02/23/2016 CLINICAL DATA:  High-grade prostate cancer post therapy. Rising PSA equal 8.2. Concern for prostate cancer recurrence. Additional history of sarcoidosis with hypermetabolic mediastinal and pelvic lymphadenopathy. EXAM: NUCLEAR MEDICINE PET SKULL BASE TO THIGH TECHNIQUE: 9.9 mCi F-18 Fluciclovine was injected intravenously. Full-ring  PET imaging was performed from the skull base to thigh after the radiotracer. CT data was obtained and used for attenuation correction and anatomic localization. COMPARISON:  FDG PET CTscan 07/31/2014 CT scan 07/27/2014 FASTING BLOOD GLUCOSE:  Not applicable FINDINGS: NECK No radiotracer accumulation in lymph nodes in the neck. CHEST No radiotracer accumulation in mediastinal or hilar nodes. Of note these lymph nodes were hypermetabolic on comparison FDG PET scan. Again demonstrated pulmonary nodules which do not accumulate the prostate specific radiotracer. Example RIGHT middle lobe nodule measures for 11 mm on image 82, series 4 which is not changed from 10 mm on prior. Small 6 mm nodule of the LEFT hemidiaphragm (image 85, series 4) also unchanged.) ABDOMEN/PELVIS Several lymph nodes within the pelvis with have intense radiotracer accumulation greater than marrow activity (marrow activity equal with SUV max 5.5). Scan interpretation is difficult as there is concurrent lymphadenopathy and in the pelvis related to sarcoidosis. There are however several lymph nodes which are enlarged and clearly accumulate radiotracer. For example LEFT obturator node measuring 13 mm on image 174, series 4 with SUV max clearly greater thanmarrow activity (SUV max equal 13). This lymph node is increased in size from 5 mm on comparison PET-CT scan. Intense activity in  small lymph node posterior to the rectum in the low perirectal fat is new from PET-CT of 07/31/2014 measuring 6 mm (image 180, series 4) with SUV max equal 5.3. New thickened RIGHT inguinal nodule measures 12 mm (image 186, series 4) increased in thickness from 8 mm with intense radiotracer accumulation (SUV max equal 13.1). Adjacent enlarged irregular RIGHT inguinal lymph node measuring 15 mm on image 181, series 4 increased from 8 mm on comparison exam and with intense radiotracer accumulation. There is no the periaortic hypermetabolic nodal activity. No abnormal activity in the liver. Physiologic activity noted in the liver and pancreas. SKELETON No focal activity to suggest skeletal metastasis. No CT evidence skeletal metastasis IMPRESSION: 1. Multiple bilateral iliac and inguinal pelvic lymph nodes which have intense Fluciclovine radiotracer accumulation consists with prostate cancer local nodal metastasis. Nodes are also increased in size from PET-CT of 07/31/2014. Lesions most accessible for biopsy would include RIGHT inguinal nodes. 2. New small 6 mm perirectal lymph node is also highly suspicious for metastasis. 3. Of note, the interpretation is difficult in that the patient has accompanying lymphadenopathy associated with sarcoidosis ; however the mediastinal lymphadenopathy which accumulates FDG activity, does not accumulate the fluciclovine amino acid radiotracer. 4. No evidence of metastatic adenopathy outside of the pelvis. 5. Stable bilateral pulmonary nodules and mediastinal adenopathy are consistent with sarcoidosis. Electronically Signed   By: Suzy Bouchard M.D.   On: 02/23/2016 17:49    Labs:  CBC:  Recent Labs  01/29/16 1136 03/03/16 1145  WBC 7.6 6.4  HGB 15.9 15.5  HCT 46.1 46.1  PLT  --  168    COAGS:  Recent Labs  03/03/16 1145  INR 1.05  APTT 33    BMP:  Recent Labs  01/29/16 1124  NA 139  K 4.3  CL 104  CO2 21  GLUCOSE 96  BUN 13  CALCIUM 9.2    CREATININE 1.15  GFRNONAA 66  GFRAA 76    LIVER FUNCTION TESTS:  Recent Labs  01/29/16 1124  BILITOT 0.7  AST 18  ALT 13  ALKPHOS 60  PROT 6.3  ALBUMIN 4.1    TUMOR MARKERS: No results for input(s): AFPTM, CEA, CA199, CHROMGRNA in the last 8760 hours.  Assessment  and Plan:  Hx Prostate Ca Known sarcoidosis Rising PSA +PET Now for Rt Inguinal Lymph node biopsy Risks and Benefits discussed with the patient including, but not limited to bleeding, infection, damage to adjacent structures or low yield requiring additional tests. All of the patient's questions were answered, patient is agreeable to proceed. Consent signed and in chart.   Thank you for this interesting consult.  I greatly enjoyed meeting [name] and look forward to participating in their care.  A copy of this report was sent to the requesting provider on this date.  Electronically Signed: Devita Nies A 03/03/2016, 1:00 PM   I spent a total of  30 Minutes   in face to face in clinical consultation, greater than 50% of which was counseling/coordinating care for Rt inguinal LAN Bx

## 2016-04-01 ENCOUNTER — Other Ambulatory Visit: Payer: Self-pay

## 2016-04-04 ENCOUNTER — Encounter: Payer: Self-pay | Admitting: Nurse Practitioner

## 2016-04-04 ENCOUNTER — Encounter: Payer: Self-pay | Admitting: Hematology & Oncology

## 2016-04-04 ENCOUNTER — Other Ambulatory Visit (HOSPITAL_BASED_OUTPATIENT_CLINIC_OR_DEPARTMENT_OTHER): Payer: BC Managed Care – PPO

## 2016-04-04 ENCOUNTER — Other Ambulatory Visit: Payer: Self-pay | Admitting: Nurse Practitioner

## 2016-04-04 ENCOUNTER — Ambulatory Visit (HOSPITAL_BASED_OUTPATIENT_CLINIC_OR_DEPARTMENT_OTHER): Payer: BC Managed Care – PPO

## 2016-04-04 ENCOUNTER — Ambulatory Visit (HOSPITAL_BASED_OUTPATIENT_CLINIC_OR_DEPARTMENT_OTHER): Payer: BC Managed Care – PPO | Admitting: Hematology & Oncology

## 2016-04-04 VITALS — BP 134/74 | HR 62 | Temp 97.4°F | Resp 18 | Ht 68.0 in | Wt 203.0 lb

## 2016-04-04 DIAGNOSIS — C61 Malignant neoplasm of prostate: Secondary | ICD-10-CM

## 2016-04-04 DIAGNOSIS — Z5111 Encounter for antineoplastic chemotherapy: Secondary | ICD-10-CM | POA: Diagnosis not present

## 2016-04-04 DIAGNOSIS — C774 Secondary and unspecified malignant neoplasm of inguinal and lower limb lymph nodes: Secondary | ICD-10-CM

## 2016-04-04 LAB — CBC WITH DIFFERENTIAL (CANCER CENTER ONLY)
BASO#: 0.1 10*3/uL (ref 0.0–0.2)
BASO%: 0.9 % (ref 0.0–2.0)
EOS ABS: 0.2 10*3/uL (ref 0.0–0.5)
EOS%: 2.6 % (ref 0.0–7.0)
HCT: 44.9 % (ref 38.7–49.9)
HGB: 16 g/dL (ref 13.0–17.1)
LYMPH#: 1.7 10*3/uL (ref 0.9–3.3)
LYMPH%: 24.7 % (ref 14.0–48.0)
MCH: 32.5 pg (ref 28.0–33.4)
MCHC: 35.6 g/dL (ref 32.0–35.9)
MCV: 91 fL (ref 82–98)
MONO#: 0.5 10*3/uL (ref 0.1–0.9)
MONO%: 7.5 % (ref 0.0–13.0)
NEUT#: 4.4 10*3/uL (ref 1.5–6.5)
NEUT%: 64.3 % (ref 40.0–80.0)
PLATELETS: 174 10*3/uL (ref 145–400)
RBC: 4.92 10*6/uL (ref 4.20–5.70)
RDW: 12.7 % (ref 11.1–15.7)
WBC: 6.8 10*3/uL (ref 4.0–10.0)

## 2016-04-04 LAB — CMP (CANCER CENTER ONLY)
ALT(SGPT): 18 U/L (ref 10–47)
AST: 22 U/L (ref 11–38)
Albumin: 3.5 g/dL (ref 3.3–5.5)
Alkaline Phosphatase: 66 U/L (ref 26–84)
BUN: 14 mg/dL (ref 7–22)
CHLORIDE: 106 meq/L (ref 98–108)
CO2: 28 meq/L (ref 18–33)
CREATININE: 1.2 mg/dL (ref 0.6–1.2)
Calcium: 8.8 mg/dL (ref 8.0–10.3)
GLUCOSE: 120 mg/dL — AB (ref 73–118)
POTASSIUM: 4.1 meq/L (ref 3.3–4.7)
SODIUM: 138 meq/L (ref 128–145)
TOTAL PROTEIN: 6.3 g/dL — AB (ref 6.4–8.1)
Total Bilirubin: 0.8 mg/dl (ref 0.20–1.60)

## 2016-04-04 MED ORDER — ABIRATERONE ACETATE 250 MG PO TABS: 1000.0000 mg | ORAL_TABLET | Freq: Every day | ORAL | 6 refills | Status: DC

## 2016-04-04 MED ORDER — PREDNISONE 5 MG PO TABS: 5.0000 mg | ORAL_TABLET | Freq: Every day | ORAL | 6 refills | Status: DC

## 2016-04-04 MED ORDER — BICALUTAMIDE 50 MG PO TABS: 50.0000 mg | ORAL_TABLET | Freq: Every day | ORAL | 6 refills | Status: DC

## 2016-04-04 MED ORDER — LEUPROLIDE ACETATE (3 MONTH) 22.5 MG IM KIT
22.5000 mg | PACK | Freq: Once | INTRAMUSCULAR | Status: AC
  Administered 2016-04-04: 22.5 mg via INTRAMUSCULAR
  Filled 2016-04-04: qty 22.5

## 2016-04-04 NOTE — Progress Notes (Signed)
Zytiga rx sent to [facility] for processing as preferred pharmacy according to insurance.

## 2016-04-04 NOTE — Patient Instructions (Signed)
Leuprolide depot injection What is this medicine? LEUPROLIDE (loo PROE lide) is a man-made protein that acts like a natural hormone in the body. It decreases testosterone in men and decreases estrogen in women. In men, this medicine is used to treat advanced prostate cancer. In women, some forms of this medicine may be used to treat endometriosis, uterine fibroids, or other male hormone-related problems. This medicine may be used for other purposes; ask your health care provider or pharmacist if you have questions. What should I tell my health care provider before I take this medicine? They need to know if you have any of these conditions: -diabetes -heart disease or previous heart attack -high blood pressure -high cholesterol -osteoporosis -pain or difficulty passing urine -spinal cord metastasis -stroke -tobacco smoker -unusual vaginal bleeding (women) -an unusual or allergic reaction to leuprolide, benzyl alcohol, other medicines, foods, dyes, or preservatives -pregnant or trying to get pregnant -breast-feeding How should I use this medicine? This medicine is for injection into a muscle or for injection under the skin. It is given by a health care professional in a hospital or clinic setting. The specific product will determine how it will be given to you. Make sure you understand which product you receive and how often you will receive it. Talk to your pediatrician regarding the use of this medicine in children. Special care may be needed. Overdosage: If you think you have taken too much of this medicine contact a poison control center or emergency room at once. NOTE: This medicine is only for you. Do not share this medicine with others. What if I miss a dose? It is important not to miss a dose. Call your doctor or health care professional if you are unable to keep an appointment. Depot injections: Depot injections are given either once-monthly, every 12 weeks, every 16 weeks, or  every 24 weeks depending on the product you are prescribed. The product you are prescribed will be based on if you are male or male, and your condition. Make sure you understand your product and dosing. What may interact with this medicine? Do not take this medicine with any of the following medications: -chasteberry This medicine may also interact with the following medications: -herbal or dietary supplements, like black cohosh or DHEA -male hormones, like estrogens or progestins and birth control pills, patches, rings, or injections -male hormones, like testosterone This list may not describe all possible interactions. Give your health care provider a list of all the medicines, herbs, non-prescription drugs, or dietary supplements you use. Also tell them if you smoke, drink alcohol, or use illegal drugs. Some items may interact with your medicine. What should I watch for while using this medicine? Visit your doctor or health care professional for regular checks on your progress. During the first weeks of treatment, your symptoms may get worse, but then will improve as you continue your treatment. You may get hot flashes, increased bone pain, increased difficulty passing urine, or an aggravation of nerve symptoms. Discuss these effects with your doctor or health care professional, some of them may improve with continued use of this medicine. Male patients may experience a menstrual cycle or spotting during the first months of therapy with this medicine. If this continues, contact your doctor or health care professional. What side effects may I notice from receiving this medicine? Side effects that you should report to your doctor or health care professional as soon as possible: -allergic reactions like skin rash, itching or hives, swelling of the   face, lips, or tongue -breathing problems -chest pain -depression or memory disorders -pain in your legs or groin -pain at site where injected or  implanted -severe headache -swelling of the feet and legs -visual changes -vomiting Side effects that usually do not require medical attention (report to your doctor or health care professional if they continue or are bothersome): -breast swelling or tenderness -decrease in sex drive or performance -diarrhea -hot flashes -loss of appetite -muscle, joint, or bone pains -nausea -redness or irritation at site where injected or implanted -skin problems or acne This list may not describe all possible side effects. Call your doctor for medical advice about side effects. You may report side effects to FDA at 1-800-FDA-1088. Where should I keep my medicine? This drug is given in a hospital or clinic and will not be stored at home. NOTE: This sheet is a summary. It may not cover all possible information. If you have questions about this medicine, talk to your doctor, pharmacist, or health care provider.    2016, Elsevier/Gold Standard. (2014-04-04 14:16:23)  

## 2016-04-04 NOTE — Progress Notes (Signed)
Hematology and Oncology Follow Up Visit  [name] [id number] 05/11/1951 65 y.o. 04/04/2016   Principle Diagnosis:   Metastatic prostate cancer-hormone responsive  Current Therapy:    Lupron 22.5 mg IM every 3 months  Zytiga 1000 mg by mouth daily/Prednisone 5 mg po q day  Casodex 50 mg by mouth daily     Interim History:  [name] is back for follow-up. We first saw him back in June. I foresee, sensory, it is clear that his prostate cancer is come back. Back in early August, he had a special PET scan that was specific for prostate cancer. This showed uptake in multiple pelvic and iliac lymph nodes. There is no involvement of parenchymal organs. She had no bone involvement.  He did undergo a biopsy of one of the inguinal lymph nodes. The pathology report [id number]) shows metastatic adenocarcinoma, consistent with prostate cancer.  He did have a PSA done. This was 8.2.  He now comes in for follow-up. He feels okay. He still lives down the [place] area. 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  He does have some fatigue. He does have some chronic back discomfort. He's had no nausea or vomiting. He's had no cough or shortness of breath. He's had no change in bowel or bladder habits.  His appetite is okay.  Overall, his performance status is ECOG 0.  Medications:  Current Outpatient Prescriptions:  .  amLODipine (NORVASC) 2.5 MG tablet, Take 1 tablet (2.5 mg total) by mouth daily., Disp: 90 tablet, Rfl: 0 .  aspirin EC 81 MG tablet, Take 81 mg by mouth daily., Disp: , Rfl:  .  cholecalciferol (VITAMIN D) 1000 UNITS tablet, Take 1,000 Units by mouth daily., Disp: , Rfl:  .  fluticasone (FLONASE) 50 MCG/ACT nasal spray, Place 1 spray into both nostrils daily as needed (congestion)., Disp: 16 g, Rfl: 11 .  ibuprofen (ADVIL,MOTRIN) 200 MG tablet, Take 400 mg by mouth every 6 (six) hours as needed (pain)., Disp: , Rfl:  .  lisinopril (PRINIVIL,ZESTRIL) 20  MG tablet, Take 0.5 tablets (10 mg total) by mouth daily., Disp: 45 tablet, Rfl: 3 .  meloxicam (MOBIC) 15 MG tablet, Take 1 tablet (15 mg total) by mouth daily. with food (Patient taking differently: Take 15 mg by mouth daily as needed (arthritis/ knee pain). with food), Disp: 30 tablet, Rfl: 5 .  niacin 500 MG tablet, Take 500 mg by mouth 2 (two) times daily., Disp: , Rfl:  .  tamsulosin (FLOMAX) 0.4 MG CAPS capsule, Take 1 tablet twice a day (Patient taking differently: Take 0.4 mg by mouth 2 (two) times daily. ), Disp: 180 capsule, Rfl: 3 .  venlafaxine (EFFEXOR) 75 MG tablet, Take 1 tablet daily for 1 week then 1 tablet every other day for one week then 1 tablet every 3 days for one week then off. (Patient taking differently: Take 75 mg by mouth See admin instructions. Take 1 tablet (75 mg) by mouth 2 times daily. When ready to change to Lexapro taper down as follows: take 1 tablet (75 mg) daily for 1 week, then 1 tablet every other day for 1 week, then 1 tablet every 3 days for one week, then stop.), Disp: 30 tablet, Rfl: 0 .  vitamin B-12 (CYANOCOBALAMIN) 1000 MCG tablet, Take 1,000 mcg by mouth daily., Disp: , Rfl:  .  abiraterone Acetate (ZYTIGA) 250 MG tablet, Take 4 tablets (1,000 mg total) by mouth daily. Take on an empty stomach 1 hour before  or 2 hours after a meal, Disp: 120 tablet, Rfl: 6 .  bicalutamide (CASODEX) 50 MG tablet, Take 1 tablet (50 mg total) by mouth daily., Disp: 30 tablet, Rfl: 6 .  predniSONE (DELTASONE) 5 MG tablet, Take 1 tablet (5 mg total) by mouth daily with breakfast., Disp: 30 tablet, Rfl: 6  Allergies: No Known Allergies  Past Medical History, Surgical history, Social history, and Family History were reviewed and updated.  Review of Systems: As above  Physical Exam:  height is 5\' 8"  (1.727 m) and weight is 203 lb (92.1 kg). His oral temperature is 97.4 F (36.3 C). His blood pressure is 134/74 and his pulse is 62. His respiration is 18.   Wt Readings from  Last 3 Encounters:  04/04/16 203 lb (92.1 kg)  03/03/16 202 lb (91.6 kg)  01/29/16 204 lb 9.6 oz (92.8 kg)      Well-developed and well-nourished white male in no obvious distress. Head and neck exam shows no ocular or oral lesions. There are no palpable cervical or supraclavicular lymph nodes. Lungs are clear. Cardiac exam regular rate and rhythm with no murmurs, rubs or bruits. Abdomen is soft. He has good bowel sounds. There is no fluid wave. There is no palpable liver or spleen tip. Back exam shows no tenderness over the spine, ribs or hips. Extremities shows no clubbing, cyanosis or edema. Neurological exam shows no focal neurological deficits. Skin exam shows no rashes, ecchymoses or petechia.  Lab Results  Component Value Date   WBC 6.8 04/04/2016   HGB 16.0 04/04/2016   HCT 44.9 04/04/2016   MCV 91 04/04/2016   PLT 174 04/04/2016     Chemistry      Component Value Date/Time   NA 138 04/04/2016 0905   K 4.1 04/04/2016 0905   CL 106 04/04/2016 0905   CO2 28 04/04/2016 0905   BUN 14 04/04/2016 0905   CREATININE 1.2 04/04/2016 0905      Component Value Date/Time   CALCIUM 8.8 04/04/2016 0905   ALKPHOS 66 04/04/2016 0905   AST 22 04/04/2016 0905   ALT 18 04/04/2016 0905   BILITOT 0.80 04/04/2016 0905         Impression and Plan: [name] is A 65 year old white male. He has recurrent prostate cancer. This would be considered metastatic as it is now on lymph nodes.  We have to make him androgen deplete. I told him that prostate cancer is fed by testosterone. As such, we have to get rid of the testosterone. The simplest way is with one of our testosterone depletion shots. We will go ahead and use Lupron on him.  With results from the new clinical trials, adding Zytiga now has been shown to markedly improve survival. I talked to him and his wife about this. I think this would be a very good idea for him. I went over some of the side effects with Zytiga. He does  understand this. He agrees to take the [place]. He will also get low-dose prednisone with the Zytiga.  I also will start him on Casodex. I think this is reasonable also. This will affect total androgen deprivation.  For now, I will plan to get him back in 6 weeks. I did this to be reasonable. Hopefully, we will see that his PSA will have gone down to normal at that point. I will recheck his testosterone.  I spent about 40-45 minutes with he and his wife. I answered their questions.  I probably would  not repeat a PET scan for another 3 months.   Volanda Napoleon, MD 9/11/201710:48 AM

## 2016-04-05 ENCOUNTER — Encounter: Payer: Self-pay | Admitting: *Deleted

## 2016-04-05 LAB — PSA: PROSTATE SPECIFIC AG, SERUM: 16 ng/mL — AB (ref 0.0–4.0)

## 2016-04-06 ENCOUNTER — Telehealth: Payer: Self-pay | Admitting: *Deleted

## 2016-04-06 NOTE — Telephone Encounter (Signed)
Received a call from Patient wife Helene Kelp asking about status of Zytiga.  Called [facility] where Rx was mailed.  They stated they are finishing up insurance information and would be calling patient in next few days.  Left voice mail for Helene Kelp on personal cell phone.

## 2016-04-09 ENCOUNTER — Other Ambulatory Visit: Payer: Self-pay | Admitting: Emergency Medicine

## 2016-04-09 DIAGNOSIS — C61 Malignant neoplasm of prostate: Secondary | ICD-10-CM

## 2016-04-15 ENCOUNTER — Other Ambulatory Visit: Payer: Self-pay | Admitting: Emergency Medicine

## 2016-04-16 NOTE — Telephone Encounter (Signed)
Has patient weaned of Effexor.  Does he need refills for this or Lexapro.  Going off note from July visit with Dr. Everlene Farrier.

## 2016-04-19 ENCOUNTER — Other Ambulatory Visit: Payer: BC Managed Care – PPO

## 2016-04-19 ENCOUNTER — Other Ambulatory Visit: Payer: Self-pay

## 2016-04-19 DIAGNOSIS — F329 Major depressive disorder, single episode, unspecified: Secondary | ICD-10-CM

## 2016-04-19 DIAGNOSIS — F32A Depression, unspecified: Secondary | ICD-10-CM

## 2016-04-19 NOTE — Telephone Encounter (Signed)
LMOM for pt to CB.  

## 2016-04-19 NOTE — Telephone Encounter (Signed)
Spoke with pharmacist.  This request for Effexor was automatic refill.  They will cancel this.  Pt has already weaned and has been getting Lexapro per pharmacist.

## 2016-04-20 MED ORDER — VENLAFAXINE HCL 75 MG PO TABS: ORAL_TABLET | ORAL | 0 refills | Status: DC

## 2016-04-20 NOTE — Telephone Encounter (Signed)
Pt's wife called and LM explaining that pt has not weaned off effexor yet because he got his Dx of cancer right after this plan was made and he didn't want to make any changes yet. Pt has not started taking the Lexapro yet, and does need a RF of Effexor. I will send in a RF and forward message to Dr Everlene Farrier for review. Dr Everlene Farrier, if you want to do anything differently let me know, I just didn't want pt to be without his medication.

## 2016-05-04 ENCOUNTER — Other Ambulatory Visit: Payer: Self-pay

## 2016-05-04 NOTE — Telephone Encounter (Signed)
Got faxed req for venlafaxine. The script written 9/27 for tapering off was marked as "phone in", so don't know if it was received. I called pharm and left the detailed instr's for tapering with Rx.

## 2016-05-11 ENCOUNTER — Ambulatory Visit (HOSPITAL_BASED_OUTPATIENT_CLINIC_OR_DEPARTMENT_OTHER): Payer: Medicare Other | Admitting: Hematology & Oncology

## 2016-05-11 ENCOUNTER — Other Ambulatory Visit (HOSPITAL_BASED_OUTPATIENT_CLINIC_OR_DEPARTMENT_OTHER): Payer: BC Managed Care – PPO

## 2016-05-11 ENCOUNTER — Encounter: Payer: Self-pay | Admitting: Hematology & Oncology

## 2016-05-11 ENCOUNTER — Encounter: Payer: Self-pay | Admitting: *Deleted

## 2016-05-11 VITALS — BP 118/72 | HR 60 | Temp 98.2°F | Resp 16 | Ht 68.0 in | Wt 198.4 lb

## 2016-05-11 DIAGNOSIS — C774 Secondary and unspecified malignant neoplasm of inguinal and lower limb lymph nodes: Secondary | ICD-10-CM | POA: Diagnosis not present

## 2016-05-11 DIAGNOSIS — C61 Malignant neoplasm of prostate: Secondary | ICD-10-CM | POA: Diagnosis present

## 2016-05-11 DIAGNOSIS — Z23 Encounter for immunization: Secondary | ICD-10-CM

## 2016-05-11 DIAGNOSIS — R319 Hematuria, unspecified: Secondary | ICD-10-CM

## 2016-05-11 LAB — URINALYSIS, MICROSCOPIC (CHCC SATELLITE)
Bilirubin (Urine): NEGATIVE
Glucose: NEGATIVE mg/dL
KETONES: NEGATIVE mg/dL
Leukocyte Esterase: NEGATIVE
Nitrite: NEGATIVE
Protein: NEGATIVE mg/dL
SPECIFIC GRAVITY, URINE: 1.02 (ref 1.003–1.035)
Urobilinogen, UR: 0.2 mg/dL (ref 0.2–1)
pH: 6 (ref 4.60–8.00)

## 2016-05-11 LAB — CBC WITH DIFFERENTIAL (CANCER CENTER ONLY)
BASO#: 0.1 10*3/uL (ref 0.0–0.2)
BASO%: 0.7 % (ref 0.0–2.0)
EOS%: 1.6 % (ref 0.0–7.0)
Eosinophils Absolute: 0.1 10*3/uL (ref 0.0–0.5)
HEMATOCRIT: 42.6 % (ref 38.7–49.9)
HEMOGLOBIN: 15.3 g/dL (ref 13.0–17.1)
LYMPH#: 1.4 10*3/uL (ref 0.9–3.3)
LYMPH%: 18.4 % (ref 14.0–48.0)
MCH: 33 pg (ref 28.0–33.4)
MCHC: 35.9 g/dL (ref 32.0–35.9)
MCV: 92 fL (ref 82–98)
MONO#: 0.5 10*3/uL (ref 0.1–0.9)
MONO%: 6.9 % (ref 0.0–13.0)
NEUT%: 72.4 % (ref 40.0–80.0)
NEUTROS ABS: 5.5 10*3/uL (ref 1.5–6.5)
Platelets: 187 10*3/uL (ref 145–400)
RBC: 4.64 10*6/uL (ref 4.20–5.70)
RDW: 12.3 % (ref 11.1–15.7)
WBC: 7.6 10*3/uL (ref 4.0–10.0)

## 2016-05-11 LAB — COMPREHENSIVE METABOLIC PANEL (CC13)
A/G RATIO: 1.7 (ref 1.2–2.2)
ALBUMIN: 4.2 g/dL (ref 3.6–4.8)
ALT: 11 IU/L (ref 0–44)
AST (SGOT): 15 IU/L (ref 0–40)
Alkaline Phosphatase, S: 75 IU/L (ref 39–117)
BUN / CREAT RATIO: 15 (ref 10–24)
BUN: 16 mg/dL (ref 8–27)
Bilirubin Total: 0.6 mg/dL (ref 0.0–1.2)
CALCIUM: 9.9 mg/dL (ref 8.6–10.2)
CREATININE: 1.04 mg/dL (ref 0.76–1.27)
Carbon Dioxide, Total: 27 mmol/L (ref 18–29)
Chloride, Ser: 103 mmol/L (ref 96–106)
GFR, EST AFRICAN AMERICAN: 85 mL/min/{1.73_m2} (ref >59)
GFR, EST NON AFRICAN AMERICAN: 74 mL/min/{1.73_m2} (ref >59)
GLOBULIN, TOTAL: 2.5 g/dL (ref 1.5–4.5)
Glucose: 120 mg/dL — ABNORMAL HIGH (ref 65–99)
POTASSIUM: 4.4 mmol/L (ref 3.5–5.2)
SODIUM: 138 mmol/L (ref 134–144)
TOTAL PROTEIN: 6.7 g/dL (ref 6.0–8.5)

## 2016-05-11 MED ORDER — INFLUENZA VAC SPLIT QUAD 0.5 ML IM SUSY
0.5000 mL | PREFILLED_SYRINGE | Freq: Once | INTRAMUSCULAR | Status: DC
  Filled 2016-05-11: qty 0.5

## 2016-05-11 NOTE — Progress Notes (Signed)
Hematology and Oncology Follow Up Visit  [name] [id number] Jun 19, 1951 65 y.o. 05/11/2016   Principle Diagnosis:   Metastatic prostate cancer-hormone responsive  Current Therapy:   Lupron 22.5 mg IM every 3 months-next dose 06/2016 Zytiga 1000 mg by mouth daily/prednisone 5 mg by mouth daily Casodex 50 mg by mouth daily     Interim History:  [name] is back for follow-up. We first saw him back in September. At that point time, his PSA had gone up to 16. I got him on androgen deprivation therapy. He is doing okay with it. He does feel more fatigued.  He's had no problems with the Zytiga. He's had no nausea or vomiting. He's lost 5 pounds. His appetite is doing okay.  He has not had any issues with bleeding or bruising.  He is still working as a Careers adviser.  He has had some hot flashes. He's had a couple night sweats.  He has had no rashes. He's had no leg swelling.  He's had no abdominal pain. He cannot palpate the lymph node in the right inguinal region that wasn't biopsied.  Overall, his performance status is ECOG 1.  Medications:  Current Outpatient Prescriptions:  .  abiraterone Acetate (ZYTIGA) 250 MG tablet, Take 4 tablets (1,000 mg total) by mouth daily. Take on an empty stomach 1 hour before or 2 hours after a meal, Disp: 120 tablet, Rfl: 6 .  amLODipine (NORVASC) 2.5 MG tablet, Take 1 tablet (2.5 mg total) by mouth daily., Disp: 90 tablet, Rfl: 0 .  aspirin EC 81 MG tablet, Take 81 mg by mouth daily., Disp: , Rfl:  .  bicalutamide (CASODEX) 50 MG tablet, Take 1 tablet (50 mg total) by mouth daily., Disp: 30 tablet, Rfl: 6 .  cholecalciferol (VITAMIN D) 1000 UNITS tablet, Take 1,000 Units by mouth daily., Disp: , Rfl:  .  fluticasone (FLONASE) 50 MCG/ACT nasal spray, Place 1 spray into both nostrils daily as needed (congestion)., Disp: 16 g, Rfl: 11 .  ibuprofen (ADVIL,MOTRIN) 200 MG tablet, Take 400 mg by mouth every 6 (six) hours as needed  (pain)., Disp: , Rfl:  .  lisinopril (PRINIVIL,ZESTRIL) 20 MG tablet, Take 0.5 tablets (10 mg total) by mouth daily., Disp: 45 tablet, Rfl: 3 .  meloxicam (MOBIC) 15 MG tablet, Take 1 tablet (15 mg total) by mouth daily. with food (Patient taking differently: Take 15 mg by mouth daily as needed (arthritis/ knee pain). with food), Disp: 30 tablet, Rfl: 5 .  niacin 500 MG tablet, Take 500 mg by mouth 2 (two) times daily., Disp: , Rfl:  .  predniSONE (DELTASONE) 5 MG tablet, Take 1 tablet (5 mg total) by mouth daily with breakfast., Disp: 30 tablet, Rfl: 6 .  tamsulosin (FLOMAX) 0.4 MG CAPS capsule, TAKE ONE CAPSULE BY MOUTH TWICE A DAY, Disp: 180 capsule, Rfl: 0 .  venlafaxine (EFFEXOR) 75 MG tablet, Take 1 tab by mouth 2 times daily. When ready taper down: take 1 tab daily for 1 wk, then 1 tab every other day for 1 wk, then 1 tab every 3 days for one week, then stop., Disp: 180 tablet, Rfl: 0 .  vitamin B-12 (CYANOCOBALAMIN) 1000 MCG tablet, Take 1,000 mcg by mouth daily., Disp: , Rfl:  .  escitalopram (LEXAPRO) 10 MG tablet, , Disp: , Rfl:   Allergies: No Known Allergies  Past Medical History, Surgical history, Social history, and Family History were reviewed and updated.  Review of Systems:  As above  Physical  Exam:  height is 5\' 8"  (1.727 m) and weight is 198 lb 6.4 oz (90 kg). His oral temperature is 98.2 F (36.8 C). His blood pressure is 118/72 and his pulse is 60. His respiration is 16.   Wt Readings from Last 3 Encounters:  05/11/16 198 lb 6.4 oz (90 kg)  04/04/16 203 lb (92.1 kg)  03/03/16 202 lb (91.6 kg)      Well-developed and well-nourished white male in no obvious distress. Head and neck exam shows no ocular or oral lesions. There are no palpable cervical or supraclavicular lymph nodes. Lungs are clear. Cardiac exam regular rate and rhythm with no murmurs, rubs or bruits. Abdomen is soft. He has good bowel sounds. There is no fluid wave. There is no palpable liver or spleen  tip. I cannot palpate any inguinal adenopathy bilaterally. Back exam shows no tenderness over the spine, ribs or hips. Externally shows no clubbing, cyanosis or edema. Has good range of motion of his joints. Skin exam shows no rashes, ecchymoses or petechia. Neurological exam shows no focal neurological deficits.  Lab Results  Component Value Date   WBC 7.6 05/11/2016   HGB 15.3 05/11/2016   HCT 42.6 05/11/2016   MCV 92 05/11/2016   PLT 187 05/11/2016     Chemistry      Component Value Date/Time   NA 138 04/04/2016 0905   K 4.1 04/04/2016 0905   CL 106 04/04/2016 0905   CO2 28 04/04/2016 0905   BUN 14 04/04/2016 0905   CREATININE 1.2 04/04/2016 0905      Component Value Date/Time   CALCIUM 8.8 04/04/2016 0905   ALKPHOS 66 04/04/2016 0905   AST 22 04/04/2016 0905   ALT 18 04/04/2016 0905   BILITOT 0.80 04/04/2016 0905         Impression and Plan: [name] is A 65 year old white male. He has metastatic hormone responsive prostate cancer. He is on androgen deprivation therapy.  We will see what his PSA is. Hopefully it is less than 10.  I don't see that we have to do any scans on him right now. He had a PET scan in the past which showed the involvement of lymph nodes. There is no bone involvement.  I will like to see him back in December. We see him back, we will give him Lupron.  I spent about 40 minutes with he and his wife.   Volanda Napoleon, MD 10/18/20174:26 PM

## 2016-05-12 LAB — TESTOSTERONE

## 2016-05-12 LAB — PSA: Prostate Specific Ag, Serum: 3.2 ng/mL (ref 0.0–4.0)

## 2016-06-03 ENCOUNTER — Other Ambulatory Visit: Payer: Self-pay | Admitting: Hematology & Oncology

## 2016-06-03 DIAGNOSIS — C61 Malignant neoplasm of prostate: Secondary | ICD-10-CM

## 2016-06-23 ENCOUNTER — Telehealth: Payer: Self-pay | Admitting: *Deleted

## 2016-06-23 ENCOUNTER — Other Ambulatory Visit: Payer: Self-pay | Admitting: Hematology & Oncology

## 2016-06-23 DIAGNOSIS — G47429 Narcolepsy in conditions classified elsewhere without cataplexy: Secondary | ICD-10-CM

## 2016-06-23 MED ORDER — METHYLPHENIDATE HCL 5 MG PO TABS: 5.0000 mg | ORAL_TABLET | Freq: Two times a day (BID) | ORAL | 0 refills | Status: DC

## 2016-06-23 NOTE — Telephone Encounter (Signed)
Per the patient's wife, they had a conversation with Dr Marin Olp about a prescription for ritalin for patient's mental fog and fatigue.   Spoke to Dr Marin Olp and he provided a prescription for Ritalin. Left message on Teresa's voicemail letting her know prescription was ready for pick up.

## 2016-06-24 ENCOUNTER — Encounter: Payer: Self-pay | Admitting: *Deleted

## 2016-06-24 ENCOUNTER — Telehealth: Payer: Self-pay | Admitting: *Deleted

## 2016-06-24 NOTE — Telephone Encounter (Signed)
Verbal consent given for June Leap to pick up prescriptions

## 2016-07-01 ENCOUNTER — Other Ambulatory Visit: Payer: Self-pay | Admitting: Emergency Medicine

## 2016-07-01 DIAGNOSIS — I10 Essential (primary) hypertension: Secondary | ICD-10-CM

## 2016-07-11 ENCOUNTER — Other Ambulatory Visit: Payer: Self-pay | Admitting: Emergency Medicine

## 2016-07-11 DIAGNOSIS — C61 Malignant neoplasm of prostate: Secondary | ICD-10-CM

## 2016-07-15 ENCOUNTER — Ambulatory Visit (HOSPITAL_BASED_OUTPATIENT_CLINIC_OR_DEPARTMENT_OTHER): Payer: BC Managed Care – PPO | Admitting: Hematology & Oncology

## 2016-07-15 ENCOUNTER — Other Ambulatory Visit (HOSPITAL_BASED_OUTPATIENT_CLINIC_OR_DEPARTMENT_OTHER): Payer: BC Managed Care – PPO

## 2016-07-15 ENCOUNTER — Ambulatory Visit (HOSPITAL_BASED_OUTPATIENT_CLINIC_OR_DEPARTMENT_OTHER): Payer: BC Managed Care – PPO

## 2016-07-15 VITALS — BP 147/84 | HR 61 | Temp 98.6°F | Resp 18 | Wt 201.0 lb

## 2016-07-15 DIAGNOSIS — Z5111 Encounter for antineoplastic chemotherapy: Secondary | ICD-10-CM | POA: Diagnosis not present

## 2016-07-15 DIAGNOSIS — R319 Hematuria, unspecified: Secondary | ICD-10-CM

## 2016-07-15 DIAGNOSIS — C61 Malignant neoplasm of prostate: Secondary | ICD-10-CM | POA: Diagnosis not present

## 2016-07-15 DIAGNOSIS — E291 Testicular hypofunction: Secondary | ICD-10-CM | POA: Diagnosis not present

## 2016-07-15 DIAGNOSIS — C774 Secondary and unspecified malignant neoplasm of inguinal and lower limb lymph nodes: Secondary | ICD-10-CM

## 2016-07-15 DIAGNOSIS — Z23 Encounter for immunization: Secondary | ICD-10-CM

## 2016-07-15 LAB — CMP (CANCER CENTER ONLY)
ALT: 13 U/L (ref 10–47)
AST: 26 U/L (ref 11–38)
Albumin: 3.9 g/dL (ref 3.3–5.5)
Alkaline Phosphatase: 70 U/L (ref 26–84)
BILIRUBIN TOTAL: 1 mg/dL (ref 0.20–1.60)
BUN: 13 mg/dL (ref 7–22)
CALCIUM: 9.2 mg/dL (ref 8.0–10.3)
CO2: 27 meq/L (ref 18–33)
Chloride: 110 mEq/L — ABNORMAL HIGH (ref 98–108)
Creat: 1.3 mg/dl — ABNORMAL HIGH (ref 0.6–1.2)
GLUCOSE: 106 mg/dL (ref 73–118)
Potassium: 4.1 mEq/L (ref 3.3–4.7)
Sodium: 145 mEq/L (ref 128–145)
Total Protein: 6.6 g/dL (ref 6.4–8.1)

## 2016-07-15 LAB — CBC WITH DIFFERENTIAL (CANCER CENTER ONLY)
BASO#: 0.1 10*3/uL (ref 0.0–0.2)
BASO%: 0.7 % (ref 0.0–2.0)
EOS%: 1.1 % (ref 0.0–7.0)
Eosinophils Absolute: 0.1 10*3/uL (ref 0.0–0.5)
HEMATOCRIT: 40.7 % (ref 38.7–49.9)
HGB: 14.6 g/dL (ref 13.0–17.1)
LYMPH#: 1.3 10*3/uL (ref 0.9–3.3)
LYMPH%: 13.1 % — AB (ref 14.0–48.0)
MCH: 33.2 pg (ref 28.0–33.4)
MCHC: 35.9 g/dL (ref 32.0–35.9)
MCV: 93 fL (ref 82–98)
MONO#: 0.6 10*3/uL (ref 0.1–0.9)
MONO%: 6.1 % (ref 0.0–13.0)
NEUT#: 7.7 10*3/uL — ABNORMAL HIGH (ref 1.5–6.5)
NEUT%: 79 % (ref 40.0–80.0)
PLATELETS: 182 10*3/uL (ref 145–400)
RBC: 4.4 10*6/uL (ref 4.20–5.70)
RDW: 12.7 % (ref 11.1–15.7)
WBC: 9.8 10*3/uL (ref 4.0–10.0)

## 2016-07-15 MED ORDER — LEUPROLIDE ACETATE (4 MONTH) 30 MG IM KIT
45.0000 mg | PACK | Freq: Once | INTRAMUSCULAR | Status: AC
  Administered 2016-07-15: 45 mg via INTRAMUSCULAR
  Filled 2016-07-15: qty 60

## 2016-07-15 NOTE — Progress Notes (Signed)
Hematology and Oncology Follow Up Visit  [name] [id number] [birth year] 65 y.o. 07/15/2016   Principle Diagnosis:   Metastatic prostate cancer-hormone responsive  Current Therapy:   Lupron 22.5 mg IM every 6 months-next dose  12/2016 Zytiga 1000 mg by mouth daily/prednisone 5 mg by mouth daily Casodex 50 mg by mouth daily     Interim History:  [name] is back for follow-up. His PSAs come down very nicely. Last him we checked it the PSA was down to 3.2.  He has have some problems with hypo-testosterone now. He is more moody. He does not have as much energy. Unfortunately, does not much we can do about this. I did give him a little Ritalin. Hopefully this might help a little bit.  He has not had any problems with bowels or bladder.  He's had no problem with pain.  We have not had to do any additional scans on him because the PSA has spotted nicely.  Overall, his performance status is ECOG 1.  Medications:  Current Outpatient Prescriptions:  .  amLODipine (NORVASC) 2.5 MG tablet, Take 1 tablet (2.5 mg total) by mouth daily., Disp: 90 tablet, Rfl: 0 .  aspirin EC 81 MG tablet, Take 81 mg by mouth daily., Disp: , Rfl:  .  bicalutamide (CASODEX) 50 MG tablet, Take 1 tablet (50 mg total) by mouth daily., Disp: 30 tablet, Rfl: 6 .  cholecalciferol (VITAMIN D) 1000 UNITS tablet, Take 1,000 Units by mouth daily., Disp: , Rfl:  .  escitalopram (LEXAPRO) 10 MG tablet, , Disp: , Rfl:  .  fluticasone (FLONASE) 50 MCG/ACT nasal spray, Place 1 spray into both nostrils daily as needed (congestion)., Disp: 16 g, Rfl: 11 .  ibuprofen (ADVIL,MOTRIN) 200 MG tablet, Take 400 mg by mouth every 6 (six) hours as needed (pain)., Disp: , Rfl:  .  meloxicam (MOBIC) 15 MG tablet, Take 1 tablet (15 mg total) by mouth daily. with food (Patient taking differently: Take 15 mg by mouth daily as needed (arthritis/ knee pain). with food), Disp: 30 tablet, Rfl: 5 .  methylphenidate (RITALIN) 5 MG  tablet, Take 1 tablet (5 mg total) by mouth 2 (two) times daily., Disp: 60 tablet, Rfl: 0 .  niacin 500 MG tablet, Take 500 mg by mouth 2 (two) times daily., Disp: , Rfl:  .  predniSONE (DELTASONE) 5 MG tablet, Take 1 tablet (5 mg total) by mouth daily with breakfast., Disp: 30 tablet, Rfl: 6 .  tamsulosin (FLOMAX) 0.4 MG CAPS capsule, TAKE ONE CAPSULE BY MOUTH TWICE A DAY, Disp: 180 capsule, Rfl: 0 .  venlafaxine (EFFEXOR) 75 MG tablet, Take 1 tab by mouth 2 times daily. When ready taper down: take 1 tab daily for 1 wk, then 1 tab every other day for 1 wk, then 1 tab every 3 days for one week, then stop., Disp: 180 tablet, Rfl: 0 .  vitamin B-12 (CYANOCOBALAMIN) 1000 MCG tablet, Take 1,000 mcg by mouth daily., Disp: , Rfl:  .  ZYTIGA 250 MG tablet, TAKE 4 TABLETS (1000 MG TOTAL) BY MOUTH ONCE DAILY. TAKE ON AN EMPTY STOMACH (AT LEAST 1 HR BEFORE OR 2 HR AFTER EATING) SWALLOW WHOLE., Disp: 120 tablet, Rfl: 5  Allergies: No Known Allergies  Past Medical History, Surgical history, Social history, and Family History were reviewed and updated.  Review of Systems:  As above  Physical Exam:  weight is 201 lb (91.2 kg). His oral temperature is 98.6 F (37 C). His blood pressure is 147/84 (  abnormal) and his pulse is 61. His respiration is 18.   Wt Readings from Last 3 Encounters:  07/15/16 201 lb (91.2 kg)  05/11/16 198 lb 6.4 oz (90 kg)  04/04/16 203 lb (92.1 kg)      Well-developed and well-nourished white male in no obvious distress. Head and neck exam shows no ocular or oral lesions. There are no palpable cervical or supraclavicular lymph nodes. Lungs are clear. Cardiac exam regular rate and rhythm with no murmurs, rubs or bruits. Abdomen is soft. He has good bowel sounds. There is no fluid wave. There is no palpable liver or spleen tip. I cannot palpate any inguinal adenopathy bilaterally. Back exam shows no tenderness over the spine, ribs or hips. Externally shows no clubbing, cyanosis or  edema. Has good range of motion of his joints. Skin exam shows no rashes, ecchymoses or petechia. Neurological exam shows no focal neurological deficits.  Lab Results  Component Value Date   WBC 9.8 07/15/2016   HGB 14.6 07/15/2016   HCT 40.7 07/15/2016   MCV 93 07/15/2016   PLT 182 07/15/2016     Chemistry      Component Value Date/Time   NA 145 07/15/2016 1311   K 4.1 07/15/2016 1311   CL 110 (H) 07/15/2016 1311   CO2 27 07/15/2016 1311   BUN 13 07/15/2016 1311   CREATININE 1.3 (H) 07/15/2016 1311      Component Value Date/Time   CALCIUM 9.2 07/15/2016 1311   ALKPHOS 70 07/15/2016 1311   AST 26 07/15/2016 1311   ALT 13 07/15/2016 1311   BILITOT 1.00 07/15/2016 1311         Impression and Plan: [name] is a 65 year old white male. He has metastatic hormone responsive prostate cancer. He is on androgen deprivation therapy.  We will see what his PSA is. Hopefully it will continue to be low. I think the Zytiga definitely is helpful..  We will try him on the every six-month Lupron now. This will make life a little bit easy for him.  I want to see him back in 3 months. I want to make sure we continue to follow him every 3 months.   Volanda Napoleon, MD 12/22/20171:56 PM

## 2016-07-15 NOTE — Patient Instructions (Signed)
Leuprolide depot injection What is this medicine? LEUPROLIDE (loo PROE lide) is a man-made protein that acts like a natural hormone in the body. It decreases testosterone in men and decreases estrogen in women. In men, this medicine is used to treat advanced prostate cancer. In women, some forms of this medicine may be used to treat endometriosis, uterine fibroids, or other male hormone-related problems. This medicine may be used for other purposes; ask your health care provider or pharmacist if you have questions. COMMON BRAND NAME(S): Eligard, Lupron Depot, Lupron Depot-Ped, Viadur What should I tell my health care provider before I take this medicine? They need to know if you have any of these conditions: -diabetes -heart disease or previous heart attack -high blood pressure -high cholesterol -mental illness -osteoporosis -pain or difficulty passing urine -seizures -spinal cord metastasis -stroke -suicidal thoughts, plans, or attempt; a previous suicide attempt by you or a family member -tobacco smoker -unusual vaginal bleeding (women) -an unusual or allergic reaction to leuprolide, benzyl alcohol, other medicines, foods, dyes, or preservatives -pregnant or trying to get pregnant -breast-feeding How should I use this medicine? This medicine is for injection into a muscle or for injection under the skin. It is given by a health care professional in a hospital or clinic setting. The specific product will determine how it will be given to you. Make sure you understand which product you receive and how often you will receive it. Talk to your pediatrician regarding the use of this medicine in children. Special care may be needed. Overdosage: If you think you have taken too much of this medicine contact a poison control center or emergency room at once. NOTE: This medicine is only for you. Do not share this medicine with others. What if I miss a dose? It is important not to miss a dose.  Call your doctor or health care professional if you are unable to keep an appointment. Depot injections: Depot injections are given either once-monthly, every 12 weeks, every 16 weeks, or every 24 weeks depending on the product you are prescribed. The product you are prescribed will be based on if you are male or male, and your condition. Make sure you understand your product and dosing. What may interact with this medicine? Do not take this medicine with any of the following medications: -chasteberry This medicine may also interact with the following medications: -herbal or dietary supplements, like black cohosh or DHEA -male hormones, like estrogens or progestins and birth control pills, patches, rings, or injections -male hormones, like testosterone This list may not describe all possible interactions. Give your health care provider a list of all the medicines, herbs, non-prescription drugs, or dietary supplements you use. Also tell them if you smoke, drink alcohol, or use illegal drugs. Some items may interact with your medicine. What should I watch for while using this medicine? Visit your doctor or health care professional for regular checks on your progress. During the first weeks of treatment, your symptoms may get worse, but then will improve as you continue your treatment. You may get hot flashes, increased bone pain, increased difficulty passing urine, or an aggravation of nerve symptoms. Discuss these effects with your doctor or health care professional, some of them may improve with continued use of this medicine. Male patients may experience a menstrual cycle or spotting during the first months of therapy with this medicine. If this continues, contact your doctor or health care professional. What side effects may I notice from receiving this medicine? Side   effects that you should report to your doctor or health care professional as soon as possible: -allergic reactions like skin  rash, itching or hives, swelling of the face, lips, or tongue -breathing problems -chest pain -depression or memory disorders -pain in your legs or groin -pain at site where injected or implanted -severe headache -swelling of the feet and legs -visual changes -vomiting Side effects that usually do not require medical attention (report to your doctor or health care professional if they continue or are bothersome): -breast swelling or tenderness -decrease in sex drive or performance -diarrhea -hot flashes -loss of appetite -muscle, joint, or bone pains -nausea -redness or irritation at site where injected or implanted -skin problems or acne This list may not describe all possible side effects. Call your doctor for medical advice about side effects. You may report side effects to FDA at 1-800-FDA-1088. Where should I keep my medicine? This drug is given in a hospital or clinic and will not be stored at home. NOTE: This sheet is a summary. It may not cover all possible information. If you have questions about this medicine, talk to your doctor, pharmacist, or health care provider.  2017 Elsevier/Gold Standard (2015-12-24 09:45:17)  

## 2016-07-16 ENCOUNTER — Encounter: Payer: Self-pay | Admitting: Hematology & Oncology

## 2016-07-16 LAB — TESTOSTERONE

## 2016-07-16 LAB — PSA: PROSTATE SPECIFIC AG, SERUM: 3.1 ng/mL (ref 0.0–4.0)

## 2016-07-20 ENCOUNTER — Other Ambulatory Visit: Payer: Self-pay | Admitting: Emergency Medicine

## 2016-07-20 DIAGNOSIS — I10 Essential (primary) hypertension: Secondary | ICD-10-CM

## 2016-07-21 ENCOUNTER — Other Ambulatory Visit: Payer: Self-pay

## 2016-07-21 DIAGNOSIS — I10 Essential (primary) hypertension: Secondary | ICD-10-CM

## 2016-07-21 MED ORDER — AMLODIPINE BESYLATE 2.5 MG PO TABS: 2.5000 mg | ORAL_TABLET | Freq: Every day | ORAL | 0 refills | Status: DC

## 2016-07-21 NOTE — Telephone Encounter (Signed)
01/2016 last ov 06/2016 last labs

## 2016-07-21 NOTE — Telephone Encounter (Signed)
Pt's wife calling to request refill of amLODipine. States that pharmacy should have sent over a request but she cannot say for certain that they have. He is currently out of this med. Please call when refilled/to advise. [phone]   [facility] [place] (on file). 90 day supply would be great if possible.

## 2016-07-26 ENCOUNTER — Other Ambulatory Visit: Payer: Self-pay | Admitting: Emergency Medicine

## 2016-07-26 DIAGNOSIS — C61 Malignant neoplasm of prostate: Secondary | ICD-10-CM

## 2016-08-01 ENCOUNTER — Telehealth: Payer: Self-pay

## 2016-08-01 NOTE — Telephone Encounter (Signed)
Wife called to find out the procedure for securing medical records.   She will complete the forms and have them faxed.   [name] has an appointment on Monday.  If we can get the past five years of records sent it will be helpful.

## 2016-08-15 ENCOUNTER — Telehealth: Payer: Self-pay | Admitting: *Deleted

## 2016-08-15 ENCOUNTER — Other Ambulatory Visit: Payer: Self-pay | Admitting: *Deleted

## 2016-08-15 DIAGNOSIS — G47429 Narcolepsy in conditions classified elsewhere without cataplexy: Secondary | ICD-10-CM

## 2016-08-15 MED ORDER — METHYLPHENIDATE HCL 5 MG PO TABS: 5.0000 mg | ORAL_TABLET | Freq: Two times a day (BID) | ORAL | 0 refills | Status: DC

## 2016-08-15 NOTE — Telephone Encounter (Signed)
Wife calling to request refill on ritalin. They live over an hour away and she is unable to come get prescription. She is looking for alternatives to having come get the medication. Patient has other prescriptions filled by [facility] and she would like the ritalin to be filled through them as well.  [facility] called. According to pharmacy tech there, the prescription can be e-scribed (our system is unable) or mailed to them to fill. Spoke with Dr Marin Olp and he would like to mail the script to them. Address confirmed and script mailed today.

## 2016-08-16 ENCOUNTER — Other Ambulatory Visit: Payer: Self-pay | Admitting: *Deleted

## 2016-08-16 ENCOUNTER — Telehealth: Payer: Self-pay | Admitting: *Deleted

## 2016-08-16 DIAGNOSIS — G47429 Narcolepsy in conditions classified elsewhere without cataplexy: Secondary | ICD-10-CM

## 2016-08-16 MED ORDER — METHYLPHENIDATE HCL 5 MG PO TABS: 5.0000 mg | ORAL_TABLET | Freq: Two times a day (BID) | ORAL | 0 refills | Status: DC

## 2016-08-16 NOTE — Telephone Encounter (Signed)
Received call from Patient wife regarding Ritalin Prescription.  Patient already out and cant get here to pick up.   Called [facility] and [facility] both of which say they dont see the prescription as being sent and would not be able to send prescription to patient anyway.  [facility] until we can figure out how to get medicine to patient

## 2016-09-21 ENCOUNTER — Other Ambulatory Visit: Payer: Self-pay | Admitting: Family Medicine

## 2016-09-21 ENCOUNTER — Other Ambulatory Visit: Payer: Self-pay | Admitting: Emergency Medicine

## 2016-09-21 DIAGNOSIS — C61 Malignant neoplasm of prostate: Secondary | ICD-10-CM

## 2016-09-21 DIAGNOSIS — I10 Essential (primary) hypertension: Secondary | ICD-10-CM

## 2016-09-23 ENCOUNTER — Other Ambulatory Visit: Payer: Self-pay | Admitting: *Deleted

## 2016-09-23 DIAGNOSIS — G47429 Narcolepsy in conditions classified elsewhere without cataplexy: Secondary | ICD-10-CM

## 2016-09-23 MED ORDER — METHYLPHENIDATE HCL 5 MG PO TABS: 5.0000 mg | ORAL_TABLET | Freq: Two times a day (BID) | ORAL | 0 refills | Status: DC

## 2016-09-29 ENCOUNTER — Telehealth: Payer: Self-pay | Admitting: *Deleted

## 2016-09-29 ENCOUNTER — Other Ambulatory Visit: Payer: Self-pay | Admitting: *Deleted

## 2016-09-29 DIAGNOSIS — G47429 Narcolepsy in conditions classified elsewhere without cataplexy: Secondary | ICD-10-CM

## 2016-09-29 MED ORDER — METHYLPHENIDATE HCL 5 MG PO TABS: 5.0000 mg | ORAL_TABLET | Freq: Two times a day (BID) | ORAL | 0 refills | Status: DC

## 2016-09-29 NOTE — Telephone Encounter (Signed)
Patient's wife calling the office again because there is difficulty obtaining patient's prescription. Despite frequently informing wife that the prescription is a controlled substance and needs to be picked up in person at the office, she calls monthly c/o not being able to pick up and wanting alternatives. Last month prescription was mailed to specialty pharmacy. This was done again last week, however the pharmacy states it has still not received prescription one week later.  Spoke with pharmacy and clinic [facility]. We will make a one time exception and send a prescription via mail to the patient home. From now on, patient will have to pick prescription up from office with no exceptions.   Wife understands this. Prescription placed into mail.

## 2016-10-17 ENCOUNTER — Other Ambulatory Visit: Payer: Self-pay | Admitting: Hematology & Oncology

## 2016-10-17 DIAGNOSIS — C61 Malignant neoplasm of prostate: Secondary | ICD-10-CM

## 2016-10-18 ENCOUNTER — Other Ambulatory Visit: Payer: BC Managed Care – PPO

## 2016-10-18 ENCOUNTER — Ambulatory Visit: Payer: BC Managed Care – PPO | Admitting: Hematology & Oncology

## 2016-10-21 ENCOUNTER — Ambulatory Visit (HOSPITAL_BASED_OUTPATIENT_CLINIC_OR_DEPARTMENT_OTHER): Payer: BC Managed Care – PPO | Admitting: Hematology & Oncology

## 2016-10-21 ENCOUNTER — Other Ambulatory Visit (HOSPITAL_BASED_OUTPATIENT_CLINIC_OR_DEPARTMENT_OTHER): Payer: BC Managed Care – PPO

## 2016-10-21 DIAGNOSIS — E291 Testicular hypofunction: Secondary | ICD-10-CM

## 2016-10-21 DIAGNOSIS — C61 Malignant neoplasm of prostate: Secondary | ICD-10-CM

## 2016-10-21 DIAGNOSIS — G47429 Narcolepsy in conditions classified elsewhere without cataplexy: Secondary | ICD-10-CM

## 2016-10-21 LAB — COMPREHENSIVE METABOLIC PANEL (CC13)
A/G RATIO: 1.7 (ref 1.2–2.2)
ALBUMIN: 4.2 g/dL (ref 3.6–4.8)
ALT: 11 IU/L (ref 0–44)
AST (SGOT): 17 IU/L (ref 0–40)
Alkaline Phosphatase, S: 80 IU/L (ref 39–117)
BUN / CREAT RATIO: 17 (ref 10–24)
BUN: 17 mg/dL (ref 8–27)
Bilirubin Total: 0.7 mg/dL (ref 0.0–1.2)
CALCIUM: 9.5 mg/dL (ref 8.6–10.2)
CO2: 25 mmol/L (ref 18–29)
CREATININE: 1.03 mg/dL (ref 0.76–1.27)
Chloride, Ser: 103 mmol/L (ref 96–106)
GFR calc Af Amer: 86 mL/min/{1.73_m2} (ref >59)
GFR, EST NON AFRICAN AMERICAN: 75 mL/min/{1.73_m2} (ref >59)
GLOBULIN, TOTAL: 2.5 g/dL (ref 1.5–4.5)
Glucose: 97 mg/dL (ref 65–99)
Potassium, Ser: 4 mmol/L (ref 3.5–5.2)
SODIUM: 136 mmol/L (ref 134–144)
Total Protein: 6.7 g/dL (ref 6.0–8.5)

## 2016-10-21 LAB — CBC WITH DIFFERENTIAL (CANCER CENTER ONLY)
BASO#: 0.1 10*3/uL (ref 0.0–0.2)
BASO%: 0.6 % (ref 0.0–2.0)
EOS%: 0.4 % (ref 0.0–7.0)
Eosinophils Absolute: 0 10*3/uL (ref 0.0–0.5)
HCT: 40.6 % (ref 38.7–49.9)
HGB: 14.5 g/dL (ref 13.0–17.1)
LYMPH#: 1.3 10*3/uL (ref 0.9–3.3)
LYMPH%: 13.6 % — AB (ref 14.0–48.0)
MCH: 33.5 pg — ABNORMAL HIGH (ref 28.0–33.4)
MCHC: 35.7 g/dL (ref 32.0–35.9)
MCV: 94 fL (ref 82–98)
MONO#: 0.4 10*3/uL (ref 0.1–0.9)
MONO%: 4.1 % (ref 0.0–13.0)
NEUT%: 81.3 % — ABNORMAL HIGH (ref 40.0–80.0)
NEUTROS ABS: 7.8 10*3/uL — AB (ref 1.5–6.5)
Platelets: 187 10*3/uL (ref 145–400)
RBC: 4.33 10*6/uL (ref 4.20–5.70)
RDW: 12.2 % (ref 11.1–15.7)
WBC: 9.6 10*3/uL (ref 4.0–10.0)

## 2016-10-21 MED ORDER — METHYLPHENIDATE HCL 10 MG PO TABS
10.0000 mg | ORAL_TABLET | Freq: Two times a day (BID) | ORAL | 0 refills | Status: DC
Start: 2016-10-21 — End: 2016-11-16

## 2016-10-21 MED ORDER — METHYLPHENIDATE HCL 5 MG PO TABS: 5.0000 mg | ORAL_TABLET | Freq: Two times a day (BID) | ORAL | 0 refills | Status: DC

## 2016-10-21 MED ORDER — PREDNISONE 10 MG PO TABS: 10.0000 mg | ORAL_TABLET | Freq: Every day | ORAL | 3 refills | Status: AC

## 2016-10-21 NOTE — Progress Notes (Signed)
Hematology and Oncology Follow Up Visit  [name] [id number] [birth year] 66 y.o. 10/21/2016   Principle Diagnosis:   Metastatic prostate cancer-hormone responsive  Current Therapy:   Lupron 22.5 mg IM every 6 months-next dose  12/2016 Zytiga 1000 mg by mouth daily/prednisone 5 mg by mouth daily Casodex 50 mg by mouth daily     Interim History:  [name] is back for follow-up. His PSAs come down very nicely. Last him we checked it the PSA was down to 3.1.  He has have some problems with hypo-testosterone now. He is more moody. He does not have as much energy. Unfortunately, does not much we can do about this. I did give him a little Ritalin. This is helped a little bit. We may have to increase his dose of to 10 mg twice a day. Hopefully this might help a little bit.  He has not had any problems with bowels or bladder.  He's had no problem with pain.  We have not had to do any additional scans on him because the PSA has responded so nicely.  Overall, his performance status is ECOG 1.  Medications:  Current Outpatient Prescriptions:  .  amLODipine (NORVASC) 2.5 MG tablet, TAKE 1 TABLET (2.5 MG TOTAL) BY MOUTH DAILY., Disp: 90 tablet, Rfl: 0 .  aspirin EC 81 MG tablet, Take 81 mg by mouth daily., Disp: , Rfl:  .  bicalutamide (CASODEX) 50 MG tablet, Take 1 tablet (50 mg total) by mouth daily., Disp: 30 tablet, Rfl: 6 .  cholecalciferol (VITAMIN D) 1000 UNITS tablet, Take 1,000 Units by mouth daily., Disp: , Rfl:  .  escitalopram (LEXAPRO) 10 MG tablet, , Disp: , Rfl:  .  fluticasone (FLONASE) 50 MCG/ACT nasal spray, Place 1 spray into both nostrils daily as needed (congestion)., Disp: 16 g, Rfl: 11 .  ibuprofen (ADVIL,MOTRIN) 200 MG tablet, Take 400 mg by mouth every 6 (six) hours as needed (pain)., Disp: , Rfl:  .  meloxicam (MOBIC) 15 MG tablet, Take 1 tablet (15 mg total) by mouth daily. with food (Patient taking differently: Take 15 mg by mouth daily as needed  (arthritis/ knee pain). with food), Disp: 30 tablet, Rfl: 5 .  methylphenidate (RITALIN) 5 MG tablet, Take 1 tablet (5 mg total) by mouth 2 (two) times daily., Disp: 60 tablet, Rfl: 0 .  niacin 500 MG tablet, Take 500 mg by mouth 2 (two) times daily., Disp: , Rfl:  .  predniSONE (DELTASONE) 5 MG tablet, TAKE 1 TABLET BY MOUTH EVERY DAY WITH BREAKFAST, Disp: 30 tablet, Rfl: 6 .  tamsulosin (FLOMAX) 0.4 MG CAPS capsule, TAKE ONE CAPSULE BY MOUTH TWICE A DAY, Disp: 180 capsule, Rfl: 0 .  venlafaxine (EFFEXOR) 75 MG tablet, Take 1 tab by mouth 2 times daily. When ready taper down: take 1 tab daily for 1 wk, then 1 tab every other day for 1 wk, then 1 tab every 3 days for one week, then stop., Disp: 180 tablet, Rfl: 0 .  vitamin B-12 (CYANOCOBALAMIN) 1000 MCG tablet, Take 1,000 mcg by mouth daily., Disp: , Rfl:  .  ZYTIGA 250 MG tablet, TAKE 4 TABLETS (1000 MG TOTAL) BY MOUTH ONCE DAILY. TAKE ON AN EMPTY STOMACH (AT LEAST 1 HR BEFORE OR 2 HR AFTER EATING) SWALLOW WHOLE., Disp: 120 tablet, Rfl: 5  Allergies: No Known Allergies  Past Medical History, Surgical history, Social history, and Family History were reviewed and updated.  Review of Systems:  As above  Physical Exam:  weight is 205 lb (93 kg). His oral temperature is 98.9 F (37.2 C). His blood pressure is 127/57 (abnormal) and his pulse is 59 (abnormal). His respiration is 18 and oxygen saturation is 98%.   Wt Readings from Last 3 Encounters:  10/21/16 205 lb (93 kg)  07/15/16 201 lb (91.2 kg)  05/11/16 198 lb 6.4 oz (90 kg)      Well-developed and well-nourished white male in no obvious distress. Head and neck exam shows no ocular or oral lesions. There are no palpable cervical or supraclavicular lymph nodes. Lungs are clear. Cardiac exam regular rate and rhythm with no murmurs, rubs or bruits. Abdomen is soft. He has good bowel sounds. There is no fluid wave. There is no palpable liver or spleen tip. I cannot palpate any inguinal  adenopathy bilaterally. Back exam shows no tenderness over the spine, ribs or hips. Externally shows no clubbing, cyanosis or edema. Has good range of motion of his joints. Skin exam shows no rashes, ecchymoses or petechia. Neurological exam shows no focal neurological deficits.  Lab Results  Component Value Date   WBC 9.6 10/21/2016   HGB 14.5 10/21/2016   HCT 40.6 10/21/2016   MCV 94 10/21/2016   PLT 187 10/21/2016     Chemistry      Component Value Date/Time   NA 145 07/15/2016 1311   K 4.1 07/15/2016 1311   CL 110 (H) 07/15/2016 1311   CO2 27 07/15/2016 1311   BUN 13 07/15/2016 1311   CREATININE 1.3 (H) 07/15/2016 1311      Component Value Date/Time   CALCIUM 9.2 07/15/2016 1311   ALKPHOS 70 07/15/2016 1311   AST 26 07/15/2016 1311   ALT 13 07/15/2016 1311   BILITOT 1.00 07/15/2016 1311         Impression and Plan: [name] is a 66 year old white male. He has metastatic hormone responsive prostate cancer. He is on androgen deprivation therapy.  We will see what his PSA is. Hopefully it will continue to be low. I think the Zytiga definitely is helpful..  We will have him come back in 3 months. At that point, he will be ready for Lupron.  If his PSA is elevated, then we will re-scan him.  I just feel bad that a lot of his issues are from androgen deprivation. There just is not much we can do about this.    Volanda Napoleon, MD 3/30/20183:59 PM

## 2016-10-22 LAB — TESTOSTERONE: Testosterone, Serum: <3 ng/dL — ABNORMAL LOW (ref 264–916)

## 2016-10-22 LAB — PSA: PROSTATE SPECIFIC AG, SERUM: 2.4 ng/mL (ref 0.0–4.0)

## 2016-10-25 ENCOUNTER — Other Ambulatory Visit: Payer: Self-pay | Admitting: Hematology & Oncology

## 2016-10-25 DIAGNOSIS — C61 Malignant neoplasm of prostate: Secondary | ICD-10-CM

## 2016-11-16 ENCOUNTER — Other Ambulatory Visit: Payer: Self-pay | Admitting: Hematology & Oncology

## 2016-11-16 DIAGNOSIS — C61 Malignant neoplasm of prostate: Secondary | ICD-10-CM

## 2016-11-16 DIAGNOSIS — G47429 Narcolepsy in conditions classified elsewhere without cataplexy: Secondary | ICD-10-CM

## 2016-11-16 MED ORDER — METHYLPHENIDATE HCL 10 MG PO TABS: 10.0000 mg | ORAL_TABLET | Freq: Two times a day (BID) | ORAL | 0 refills | Status: DC

## 2016-12-20 ENCOUNTER — Other Ambulatory Visit: Payer: Self-pay | Admitting: Hematology & Oncology

## 2016-12-20 DIAGNOSIS — C61 Malignant neoplasm of prostate: Secondary | ICD-10-CM

## 2016-12-25 ENCOUNTER — Other Ambulatory Visit: Payer: Self-pay | Admitting: Physician Assistant

## 2016-12-25 DIAGNOSIS — C61 Malignant neoplasm of prostate: Secondary | ICD-10-CM

## 2016-12-25 DIAGNOSIS — I10 Essential (primary) hypertension: Secondary | ICD-10-CM

## 2017-01-02 ENCOUNTER — Other Ambulatory Visit: Payer: Self-pay | Admitting: Hematology & Oncology

## 2017-01-02 DIAGNOSIS — C61 Malignant neoplasm of prostate: Secondary | ICD-10-CM

## 2017-01-02 DIAGNOSIS — G47429 Narcolepsy in conditions classified elsewhere without cataplexy: Secondary | ICD-10-CM

## 2017-01-02 MED ORDER — METHYLPHENIDATE HCL 10 MG PO TABS: 10.0000 mg | ORAL_TABLET | Freq: Two times a day (BID) | ORAL | 0 refills | Status: AC

## 2017-01-08 ENCOUNTER — Other Ambulatory Visit: Payer: Self-pay | Admitting: Physician Assistant

## 2017-01-08 DIAGNOSIS — I10 Essential (primary) hypertension: Secondary | ICD-10-CM

## 2017-01-13 ENCOUNTER — Other Ambulatory Visit (HOSPITAL_BASED_OUTPATIENT_CLINIC_OR_DEPARTMENT_OTHER): Payer: BC Managed Care – PPO

## 2017-01-13 ENCOUNTER — Ambulatory Visit (HOSPITAL_BASED_OUTPATIENT_CLINIC_OR_DEPARTMENT_OTHER): Payer: BC Managed Care – PPO | Admitting: Hematology & Oncology

## 2017-01-13 ENCOUNTER — Ambulatory Visit (HOSPITAL_BASED_OUTPATIENT_CLINIC_OR_DEPARTMENT_OTHER): Payer: BC Managed Care – PPO

## 2017-01-13 VITALS — BP 135/81 | HR 59 | Temp 98.6°F | Resp 18 | Wt 202.0 lb

## 2017-01-13 DIAGNOSIS — C61 Malignant neoplasm of prostate: Secondary | ICD-10-CM

## 2017-01-13 DIAGNOSIS — G47429 Narcolepsy in conditions classified elsewhere without cataplexy: Secondary | ICD-10-CM

## 2017-01-13 DIAGNOSIS — F329 Major depressive disorder, single episode, unspecified: Secondary | ICD-10-CM

## 2017-01-13 DIAGNOSIS — J0120 Acute ethmoidal sinusitis, unspecified: Secondary | ICD-10-CM

## 2017-01-13 DIAGNOSIS — F32A Depression, unspecified: Secondary | ICD-10-CM

## 2017-01-13 DIAGNOSIS — I10 Essential (primary) hypertension: Secondary | ICD-10-CM

## 2017-01-13 DIAGNOSIS — E291 Testicular hypofunction: Secondary | ICD-10-CM | POA: Diagnosis not present

## 2017-01-13 DIAGNOSIS — Z5111 Encounter for antineoplastic chemotherapy: Secondary | ICD-10-CM

## 2017-01-13 DIAGNOSIS — D508 Other iron deficiency anemias: Secondary | ICD-10-CM

## 2017-01-13 DIAGNOSIS — J329 Chronic sinusitis, unspecified: Secondary | ICD-10-CM

## 2017-01-13 DIAGNOSIS — R5383 Other fatigue: Secondary | ICD-10-CM

## 2017-01-13 DIAGNOSIS — R319 Hematuria, unspecified: Secondary | ICD-10-CM

## 2017-01-13 DIAGNOSIS — Z23 Encounter for immunization: Secondary | ICD-10-CM

## 2017-01-13 LAB — CMP (CANCER CENTER ONLY)
ALK PHOS: 71 U/L (ref 26–84)
ALT: 15 U/L (ref 10–47)
AST: 27 U/L (ref 11–38)
Albumin: 3.8 g/dL (ref 3.3–5.5)
BILIRUBIN TOTAL: 1.1 mg/dL (ref 0.20–1.60)
BUN, Bld: 15 mg/dL (ref 7–22)
CO2: 26 meq/L (ref 18–33)
CREATININE: 1.4 mg/dL — AB (ref 0.6–1.2)
Calcium: 9.4 mg/dL (ref 8.0–10.3)
Chloride: 105 mEq/L (ref 98–108)
GLUCOSE: 129 mg/dL — AB (ref 73–118)
Potassium: 3.5 mEq/L (ref 3.3–4.7)
SODIUM: 138 meq/L (ref 128–145)
Total Protein: 6.7 g/dL (ref 6.4–8.1)

## 2017-01-13 LAB — CBC WITH DIFFERENTIAL (CANCER CENTER ONLY)
BASO#: 0.1 10*3/uL (ref 0.0–0.2)
BASO%: 0.4 % (ref 0.0–2.0)
EOS%: 0.9 % (ref 0.0–7.0)
Eosinophils Absolute: 0.1 10*3/uL (ref 0.0–0.5)
HCT: 43.3 % (ref 38.7–49.9)
HGB: 15.3 g/dL (ref 13.0–17.1)
LYMPH#: 0.9 10*3/uL (ref 0.9–3.3)
LYMPH%: 8.3 % — ABNORMAL LOW (ref 14.0–48.0)
MCH: 33.7 pg — ABNORMAL HIGH (ref 28.0–33.4)
MCHC: 35.3 g/dL (ref 32.0–35.9)
MCV: 95 fL (ref 82–98)
MONO#: 0.5 10*3/uL (ref 0.1–0.9)
MONO%: 4.9 % (ref 0.0–13.0)
NEUT#: 9.5 10*3/uL — ABNORMAL HIGH (ref 1.5–6.5)
NEUT%: 85.5 % — AB (ref 40.0–80.0)
Platelets: 176 10*3/uL (ref 145–400)
RBC: 4.54 10*6/uL (ref 4.20–5.70)
RDW: 12.2 % (ref 11.1–15.7)
WBC: 11.1 10*3/uL — ABNORMAL HIGH (ref 4.0–10.0)

## 2017-01-13 MED ORDER — AMOXICILLIN-POT CLAVULANATE 875-125 MG PO TABS: 1.0000 | ORAL_TABLET | Freq: Two times a day (BID) | ORAL | 0 refills | Status: AC

## 2017-01-13 MED ORDER — ESCITALOPRAM OXALATE 20 MG PO TABS
20.0000 mg | ORAL_TABLET | Freq: Every day | ORAL | 3 refills | Status: AC
Start: 2017-01-13 — End: ?

## 2017-01-13 MED ORDER — AMLODIPINE BESYLATE 2.5 MG PO TABS: 2.5000 mg | ORAL_TABLET | Freq: Every day | ORAL | 3 refills | Status: DC

## 2017-01-13 MED ORDER — LEUPROLIDE ACETATE (4 MONTH) 30 MG IM KIT
45.0000 mg | PACK | Freq: Once | INTRAMUSCULAR | Status: AC
  Administered 2017-01-13: 45 mg via INTRAMUSCULAR
  Filled 2017-01-13: qty 60

## 2017-01-13 MED ORDER — TAMSULOSIN HCL 0.4 MG PO CAPS: 0.4000 mg | ORAL_CAPSULE | Freq: Two times a day (BID) | ORAL | 0 refills | Status: AC

## 2017-01-13 NOTE — Progress Notes (Signed)
Hematology and Oncology Follow Up Visit  [name] [id number] 04/04/1951 66 y.o. 01/13/2017   Principle Diagnosis:   Metastatic prostate cancer-hormone responsive  Current Therapy:   Lupron 45 mg IM every 6 months-next dose  06/2017 Zytiga 1000 mg by mouth daily/prednisone 5 mg by mouth daily Casodex 50 mg by mouth daily     Interim History:  [name] is back for follow-up. His PSA has come down very nicely. Last him we checked it the PSA was down to 2.4.  He has a lot of problems with testosterone depletion now. He is more moody. He does not have as much energy. Unfortunately, does not much we can do about this. I did give him a little Ritalin. This is helped a little bit. He cannot take a dose at nighttime. I told him to take both 10 mg doses in the daytime.  Of course, he does not have a family doctor. I told him that he really needs to have a family doctor. For now, I will just temporal refill some of his medications.   Hopefully, he will find a family doctor close to where he lives.  He's had no rashes. He's had no fever. He does have a sinus infection. Again since he does not have a family doctor, I will go ahead and given some Augmentin. He'll take this twice a day for 7 days.   We have not had to do any additional scans on him because the PSA has responded so nicely.  Overall, his performance status is ECOG 1.  Medications:  Current Outpatient Prescriptions:  .  amLODipine (NORVASC) 2.5 MG tablet, TAKE 1 TABLET (2.5 MG TOTAL) BY MOUTH DAILY., Disp: 90 tablet, Rfl: 0 .  aspirin EC 81 MG tablet, Take 81 mg by mouth daily., Disp: , Rfl:  .  bicalutamide (CASODEX) 50 MG tablet, TAKE 1 TABLET BY MOUTH EVERY DAY, Disp: 30 tablet, Rfl: 6 .  cholecalciferol (VITAMIN D) 1000 UNITS tablet, Take 1,000 Units by mouth daily., Disp: , Rfl:  .  escitalopram (LEXAPRO) 10 MG tablet, , Disp: , Rfl:  .  fluticasone (FLONASE) 50 MCG/ACT nasal spray, Place 1 spray into both  nostrils daily as needed (congestion)., Disp: 16 g, Rfl: 11 .  ibuprofen (ADVIL,MOTRIN) 200 MG tablet, Take 400 mg by mouth every 6 (six) hours as needed (pain)., Disp: , Rfl:  .  meloxicam (MOBIC) 15 MG tablet, Take 1 tablet (15 mg total) by mouth daily. with food (Patient taking differently: Take 15 mg by mouth daily as needed (arthritis/ knee pain). with food), Disp: 30 tablet, Rfl: 5 .  methylphenidate (RITALIN) 10 MG tablet, Take 1 tablet (10 mg total) by mouth 2 (two) times daily with breakfast and lunch., Disp: 60 tablet, Rfl: 0 .  niacin 500 MG tablet, Take 500 mg by mouth 2 (two) times daily., Disp: , Rfl:  .  predniSONE (DELTASONE) 10 MG tablet, Take 1 tablet (10 mg total) by mouth daily with breakfast., Disp: 90 tablet, Rfl: 3 .  tamsulosin (FLOMAX) 0.4 MG CAPS capsule, TAKE ONE CAPSULE BY MOUTH TWICE A DAY, Disp: 180 capsule, Rfl: 0 .  venlafaxine (EFFEXOR) 75 MG tablet, Take 1 tab by mouth 2 times daily. When ready taper down: take 1 tab daily for 1 wk, then 1 tab every other day for 1 wk, then 1 tab every 3 days for one week, then stop., Disp: 180 tablet, Rfl: 0 .  vitamin B-12 (CYANOCOBALAMIN) 1000 MCG tablet, Take 1,000 mcg  by mouth daily., Disp: , Rfl:  .  ZYTIGA 250 MG tablet, TAKE 4 TABLETS (1000 MG TOTAL) BY MOUTH ONCE DAILY. TAKE ON AN EMPTY STOMACH (AT LEAST 1 HR BEFORE OR 2 HR AFTER EATING) SWALLOW WHOLE., Disp: 120 tablet, Rfl: 5  Allergies: No Known Allergies  Past Medical History, Surgical history, Social history, and Family History were reviewed and updated.  Review of Systems:  As above  Physical Exam:  weight is 202 lb (91.6 kg). His oral temperature is 98.6 F (37 C). His blood pressure is 135/81 and his pulse is 59 (abnormal). His respiration is 18 and oxygen saturation is 98%.   Wt Readings from Last 3 Encounters:  01/13/17 202 lb (91.6 kg)  10/21/16 205 lb (93 kg)  07/15/16 201 lb (91.2 kg)      Well-developed and well-nourished white male in no obvious  distress. Head and neck exam shows no ocular or oral lesions. There are no palpable cervical or supraclavicular lymph nodes. Lungs are clear. Cardiac exam regular rate and rhythm with no murmurs, rubs or bruits. Abdomen is soft. He has good bowel sounds. There is no fluid wave. There is no palpable liver or spleen tip. I cannot palpate any inguinal adenopathy bilaterally. Back exam shows no tenderness over the spine, ribs or hips. Externally shows no clubbing, cyanosis or edema. Has good range of motion of his joints. Skin exam shows no rashes, ecchymoses or petechia. Neurological exam shows no focal neurological deficits.  Lab Results  Component Value Date   WBC 11.1 (H) 01/13/2017   HGB 15.3 01/13/2017   HCT 43.3 01/13/2017   MCV 95 01/13/2017   PLT 176 01/13/2017     Chemistry      Component Value Date/Time   NA 138 01/13/2017 1419   K 3.5 01/13/2017 1419   CL 105 01/13/2017 1419   CO2 26 01/13/2017 1419   BUN 15 01/13/2017 1419   CREATININE 1.4 (H) 01/13/2017 1419      Component Value Date/Time   CALCIUM 9.4 01/13/2017 1419   ALKPHOS 71 01/13/2017 1419   AST 27 01/13/2017 1419   ALT 15 01/13/2017 1419   BILITOT 1.10 01/13/2017 1419         Impression and Plan: [name] is a 66 year old white male. He has metastatic hormone responsive prostate cancer. He is on androgen deprivation therapy.  We will see what his PSA is. Hopefully it will continue to be low. I think the Zytiga/Casodex definitely is helpful.  I know that there was a recent study that showed that a decreased dose of Zytiga with food is as effective as full dose Zytiga. I will have to do a little more research on this. If this is truly equivalent, then we might want to consider decreasing his Zytiga dose.  We will have him come back in 3 months.   If his PSA is elevated, then we will re-scan him.  Hopefully, he will be able to enjoy summer. 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 This is incredibly important to him.  I just feel bad that a lot of his issues are from androgen deprivation. There just is not much we can do about this.    Volanda Napoleon, MD 6/22/20183:09 PM

## 2017-01-13 NOTE — Patient Instructions (Signed)
Leuprolide depot injection What is this medicine? LEUPROLIDE (loo PROE lide) is a man-made protein that acts like a natural hormone in the body. It decreases testosterone in men and decreases estrogen in women. In men, this medicine is used to treat advanced prostate cancer. In women, some forms of this medicine may be used to treat endometriosis, uterine fibroids, or other male hormone-related problems. This medicine may be used for other purposes; ask your health care provider or pharmacist if you have questions. COMMON BRAND NAME(S): Eligard, Lupron Depot, Lupron Depot-Ped, Viadur What should I tell my health care provider before I take this medicine? They need to know if you have any of these conditions: -diabetes -heart disease or previous heart attack -high blood pressure -high cholesterol -mental illness -osteoporosis -pain or difficulty passing urine -seizures -spinal cord metastasis -stroke -suicidal thoughts, plans, or attempt; a previous suicide attempt by you or a family member -tobacco smoker -unusual vaginal bleeding (women) -an unusual or allergic reaction to leuprolide, benzyl alcohol, other medicines, foods, dyes, or preservatives -pregnant or trying to get pregnant -breast-feeding How should I use this medicine? This medicine is for injection into a muscle or for injection under the skin. It is given by a health care professional in a hospital or clinic setting. The specific product will determine how it will be given to you. Make sure you understand which product you receive and how often you will receive it. Talk to your pediatrician regarding the use of this medicine in children. Special care may be needed. Overdosage: If you think you have taken too much of this medicine contact a poison control center or emergency room at once. NOTE: This medicine is only for you. Do not share this medicine with others. What if I miss a dose? It is important not to miss a dose.  Call your doctor or health care professional if you are unable to keep an appointment. Depot injections: Depot injections are given either once-monthly, every 12 weeks, every 16 weeks, or every 24 weeks depending on the product you are prescribed. The product you are prescribed will be based on if you are male or male, and your condition. Make sure you understand your product and dosing. What may interact with this medicine? Do not take this medicine with any of the following medications: -chasteberry This medicine may also interact with the following medications: -herbal or dietary supplements, like black cohosh or DHEA -male hormones, like estrogens or progestins and birth control pills, patches, rings, or injections -male hormones, like testosterone This list may not describe all possible interactions. Give your health care provider a list of all the medicines, herbs, non-prescription drugs, or dietary supplements you use. Also tell them if you smoke, drink alcohol, or use illegal drugs. Some items may interact with your medicine. What should I watch for while using this medicine? Visit your doctor or health care professional for regular checks on your progress. During the first weeks of treatment, your symptoms may get worse, but then will improve as you continue your treatment. You may get hot flashes, increased bone pain, increased difficulty passing urine, or an aggravation of nerve symptoms. Discuss these effects with your doctor or health care professional, some of them may improve with continued use of this medicine. Male patients may experience a menstrual cycle or spotting during the first months of therapy with this medicine. If this continues, contact your doctor or health care professional. What side effects may I notice from receiving this medicine? Side   effects that you should report to your doctor or health care professional as soon as possible: -allergic reactions like skin  rash, itching or hives, swelling of the face, lips, or tongue -breathing problems -chest pain -depression or memory disorders -pain in your legs or groin -pain at site where injected or implanted -seizures -severe headache -swelling of the feet and legs -suicidal thoughts or other mood changes -visual changes -vomiting Side effects that usually do not require medical attention (report to your doctor or health care professional if they continue or are bothersome): -breast swelling or tenderness -decrease in sex drive or performance -diarrhea -hot flashes -loss of appetite -muscle, joint, or bone pains -nausea -redness or irritation at site where injected or implanted -skin problems or acne This list may not describe all possible side effects. Call your doctor for medical advice about side effects. You may report side effects to FDA at 1-800-FDA-1088. Where should I keep my medicine? This drug is given in a hospital or clinic and will not be stored at home. NOTE: This sheet is a summary. It may not cover all possible information. If you have questions about this medicine, talk to your doctor, pharmacist, or health care provider.  2018 Elsevier/Gold Standard (2015-12-24 09:45:53)  

## 2017-01-14 LAB — PSA: PROSTATE SPECIFIC AG, SERUM: 2.8 ng/mL (ref 0.0–4.0)

## 2017-01-14 LAB — TESTOSTERONE: Testosterone, Serum: <3 ng/dL — ABNORMAL LOW (ref 264–916)

## 2017-01-18 ENCOUNTER — Encounter: Payer: Self-pay | Admitting: Hematology & Oncology

## 2017-02-24 ENCOUNTER — Other Ambulatory Visit: Payer: BC Managed Care – PPO

## 2017-02-24 ENCOUNTER — Ambulatory Visit: Payer: BC Managed Care – PPO | Admitting: Hematology & Oncology

## 2017-05-03 ENCOUNTER — Telehealth: Payer: Self-pay | Admitting: *Deleted

## 2017-05-03 NOTE — Telephone Encounter (Signed)
Received call from [place] patient wife.  Wife wanted to let us know that patient is being seen in [place] now because the drive was getting to be so much for the patient.  Dr Marin Olp notified.

## 2017-07-05 ENCOUNTER — Other Ambulatory Visit: Payer: Self-pay | Admitting: Hematology & Oncology

## 2017-07-05 DIAGNOSIS — C61 Malignant neoplasm of prostate: Secondary | ICD-10-CM

## 2017-11-01 ENCOUNTER — Encounter: Payer: Self-pay | Admitting: Physician Assistant

## 2017-12-19 ENCOUNTER — Encounter: Payer: Self-pay | Admitting: Family Medicine

## 2018-01-19 ENCOUNTER — Other Ambulatory Visit: Payer: Self-pay | Admitting: Hematology & Oncology

## 2018-01-19 DIAGNOSIS — F32A Depression, unspecified: Secondary | ICD-10-CM

## 2018-01-19 DIAGNOSIS — C61 Malignant neoplasm of prostate: Secondary | ICD-10-CM

## 2018-01-19 DIAGNOSIS — I10 Essential (primary) hypertension: Secondary | ICD-10-CM

## 2018-01-19 DIAGNOSIS — R319 Hematuria, unspecified: Secondary | ICD-10-CM

## 2018-01-19 DIAGNOSIS — Z23 Encounter for immunization: Secondary | ICD-10-CM

## 2018-01-19 DIAGNOSIS — D508 Other iron deficiency anemias: Secondary | ICD-10-CM

## 2018-01-19 DIAGNOSIS — J0120 Acute ethmoidal sinusitis, unspecified: Secondary | ICD-10-CM

## 2018-01-19 DIAGNOSIS — F329 Major depressive disorder, single episode, unspecified: Secondary | ICD-10-CM

## 2018-01-19 DIAGNOSIS — R5383 Other fatigue: Secondary | ICD-10-CM

## 2021-10-23 DEATH — deceased

## 2023-07-24 ENCOUNTER — Encounter: Payer: Self-pay | Admitting: Hematology & Oncology
